# Patient Record
Sex: Male | Born: 1935 | Race: Black or African American | Hispanic: No | Marital: Married | Smoking: Former smoker
Health system: Southern US, Community
[De-identification: ages and names within clinical notes are randomized; demographics above are authoritative.]

## PROBLEM LIST (undated history)

## (undated) DIAGNOSIS — I69391 Dysphagia following cerebral infarction: Secondary | ICD-10-CM

## (undated) DIAGNOSIS — F0390 Unspecified dementia without behavioral disturbance: Secondary | ICD-10-CM

## (undated) DIAGNOSIS — I2510 Atherosclerotic heart disease of native coronary artery without angina pectoris: Secondary | ICD-10-CM

## (undated) DIAGNOSIS — I10 Essential (primary) hypertension: Secondary | ICD-10-CM

## (undated) DIAGNOSIS — I4891 Unspecified atrial fibrillation: Secondary | ICD-10-CM

## (undated) DIAGNOSIS — I5030 Unspecified diastolic (congestive) heart failure: Secondary | ICD-10-CM

## (undated) DIAGNOSIS — E119 Type 2 diabetes mellitus without complications: Secondary | ICD-10-CM

## (undated) DIAGNOSIS — E785 Hyperlipidemia, unspecified: Secondary | ICD-10-CM

## (undated) DIAGNOSIS — I619 Nontraumatic intracerebral hemorrhage, unspecified: Secondary | ICD-10-CM

## (undated) HISTORY — PX: PEG PLACEMENT: SHX5437

---

## 2018-10-14 ENCOUNTER — Emergency Department (HOSPITAL_COMMUNITY): Payer: Medicare Other

## 2018-10-14 ENCOUNTER — Inpatient Hospital Stay (HOSPITAL_COMMUNITY)
Admission: EM | Admit: 2018-10-14 | Discharge: 2018-10-24 | DRG: 070 | Disposition: A | Discharge status: Skilled Nursing Facility | Payer: Medicare Other | Attending: Family Medicine | Admitting: Family Medicine

## 2018-10-14 ENCOUNTER — Encounter (HOSPITAL_COMMUNITY): Payer: Self-pay | Admitting: Radiology

## 2018-10-14 ENCOUNTER — Other Ambulatory Visit: Payer: Self-pay

## 2018-10-14 DIAGNOSIS — I110 Hypertensive heart disease with heart failure: Secondary | ICD-10-CM | POA: Diagnosis present

## 2018-10-14 DIAGNOSIS — I10 Essential (primary) hypertension: Secondary | ICD-10-CM | POA: Diagnosis not present

## 2018-10-14 DIAGNOSIS — Z79899 Other long term (current) drug therapy: Secondary | ICD-10-CM

## 2018-10-14 DIAGNOSIS — R414 Neurologic neglect syndrome: Secondary | ICD-10-CM | POA: Diagnosis present

## 2018-10-14 DIAGNOSIS — K219 Gastro-esophageal reflux disease without esophagitis: Secondary | ICD-10-CM | POA: Diagnosis present

## 2018-10-14 DIAGNOSIS — Z20828 Contact with and (suspected) exposure to other viral communicable diseases: Secondary | ICD-10-CM | POA: Diagnosis present

## 2018-10-14 DIAGNOSIS — Z8673 Personal history of transient ischemic attack (TIA), and cerebral infarction without residual deficits: Secondary | ICD-10-CM | POA: Diagnosis not present

## 2018-10-14 DIAGNOSIS — I2510 Atherosclerotic heart disease of native coronary artery without angina pectoris: Secondary | ICD-10-CM | POA: Diagnosis present

## 2018-10-14 DIAGNOSIS — Z66 Do not resuscitate: Secondary | ICD-10-CM | POA: Diagnosis not present

## 2018-10-14 DIAGNOSIS — B964 Proteus (mirabilis) (morganii) as the cause of diseases classified elsewhere: Secondary | ICD-10-CM | POA: Diagnosis present

## 2018-10-14 DIAGNOSIS — R410 Disorientation, unspecified: Secondary | ICD-10-CM

## 2018-10-14 DIAGNOSIS — R1313 Dysphagia, pharyngeal phase: Secondary | ICD-10-CM | POA: Diagnosis present

## 2018-10-14 DIAGNOSIS — E785 Hyperlipidemia, unspecified: Secondary | ICD-10-CM | POA: Diagnosis present

## 2018-10-14 DIAGNOSIS — E860 Dehydration: Secondary | ICD-10-CM | POA: Diagnosis present

## 2018-10-14 DIAGNOSIS — Z7984 Long term (current) use of oral hypoglycemic drugs: Secondary | ICD-10-CM

## 2018-10-14 DIAGNOSIS — F0150 Vascular dementia without behavioral disturbance: Secondary | ICD-10-CM | POA: Diagnosis present

## 2018-10-14 DIAGNOSIS — G9340 Encephalopathy, unspecified: Secondary | ICD-10-CM | POA: Diagnosis present

## 2018-10-14 DIAGNOSIS — N390 Urinary tract infection, site not specified: Secondary | ICD-10-CM | POA: Diagnosis not present

## 2018-10-14 DIAGNOSIS — G9341 Metabolic encephalopathy: Principal | ICD-10-CM | POA: Diagnosis present

## 2018-10-14 DIAGNOSIS — R29704 NIHSS score 4: Secondary | ICD-10-CM | POA: Diagnosis present

## 2018-10-14 DIAGNOSIS — I4892 Unspecified atrial flutter: Secondary | ICD-10-CM | POA: Diagnosis present

## 2018-10-14 DIAGNOSIS — E43 Unspecified severe protein-calorie malnutrition: Secondary | ICD-10-CM | POA: Diagnosis present

## 2018-10-14 DIAGNOSIS — R1310 Dysphagia, unspecified: Secondary | ICD-10-CM

## 2018-10-14 DIAGNOSIS — Z87891 Personal history of nicotine dependence: Secondary | ICD-10-CM

## 2018-10-14 DIAGNOSIS — R4182 Altered mental status, unspecified: Secondary | ICD-10-CM | POA: Diagnosis present

## 2018-10-14 DIAGNOSIS — Z751 Person awaiting admission to adequate facility elsewhere: Secondary | ICD-10-CM

## 2018-10-14 DIAGNOSIS — Z515 Encounter for palliative care: Secondary | ICD-10-CM

## 2018-10-14 DIAGNOSIS — R29810 Facial weakness: Secondary | ICD-10-CM | POA: Diagnosis present

## 2018-10-14 DIAGNOSIS — M109 Gout, unspecified: Secondary | ICD-10-CM | POA: Diagnosis present

## 2018-10-14 DIAGNOSIS — N3000 Acute cystitis without hematuria: Secondary | ICD-10-CM

## 2018-10-14 DIAGNOSIS — I480 Paroxysmal atrial fibrillation: Secondary | ICD-10-CM | POA: Diagnosis present

## 2018-10-14 DIAGNOSIS — I5032 Chronic diastolic (congestive) heart failure: Secondary | ICD-10-CM | POA: Diagnosis present

## 2018-10-14 DIAGNOSIS — Z9181 History of falling: Secondary | ICD-10-CM

## 2018-10-14 DIAGNOSIS — E119 Type 2 diabetes mellitus without complications: Secondary | ICD-10-CM | POA: Diagnosis present

## 2018-10-14 DIAGNOSIS — I69319 Unspecified symptoms and signs involving cognitive functions following cerebral infarction: Secondary | ICD-10-CM

## 2018-10-14 DIAGNOSIS — R627 Adult failure to thrive: Secondary | ICD-10-CM | POA: Diagnosis present

## 2018-10-14 DIAGNOSIS — Z7982 Long term (current) use of aspirin: Secondary | ICD-10-CM

## 2018-10-14 HISTORY — DX: Hyperlipidemia, unspecified: E78.5

## 2018-10-14 HISTORY — DX: Essential (primary) hypertension: I10

## 2018-10-14 HISTORY — DX: Nontraumatic intracerebral hemorrhage, unspecified: I61.9

## 2018-10-14 HISTORY — DX: Atherosclerotic heart disease of native coronary artery without angina pectoris: I25.10

## 2018-10-14 LAB — COMPREHENSIVE METABOLIC PANEL
ALT: 12 U/L (ref 0–44)
AST: 18 U/L (ref 15–41)
Albumin: 3.2 g/dL — ABNORMAL LOW (ref 3.5–5.0)
Alkaline Phosphatase: 50 U/L (ref 38–126)
Anion gap: 11 (ref 5–15)
BUN: 15 mg/dL (ref 8–23)
CO2: 27 mmol/L (ref 22–32)
Calcium: 9.2 mg/dL (ref 8.9–10.3)
Chloride: 105 mmol/L (ref 98–111)
Creatinine, Ser: 0.96 mg/dL (ref 0.61–1.24)
GFR calc Af Amer: >60 mL/min (ref >60)
GFR calc non Af Amer: >60 mL/min (ref >60)
Glucose, Bld: 131 mg/dL — ABNORMAL HIGH (ref 70–99)
Potassium: 4.1 mmol/L (ref 3.5–5.1)
Sodium: 143 mmol/L (ref 135–145)
Total Bilirubin: 0.7 mg/dL (ref 0.3–1.2)
Total Protein: 7.3 g/dL (ref 6.5–8.1)

## 2018-10-14 LAB — DIFFERENTIAL
Abs Immature Granulocytes: 0.03 10*3/uL (ref 0.00–0.07)
Basophils Absolute: 0 10*3/uL (ref 0.0–0.1)
Basophils Relative: 0 %
Eosinophils Absolute: 0 10*3/uL (ref 0.0–0.5)
Eosinophils Relative: 0 %
Immature Granulocytes: 0 %
Lymphocytes Relative: 13 %
Lymphs Abs: 1.2 10*3/uL (ref 0.7–4.0)
Monocytes Absolute: 0.5 10*3/uL (ref 0.1–1.0)
Monocytes Relative: 6 %
Neutro Abs: 7.2 10*3/uL (ref 1.7–7.7)
Neutrophils Relative %: 81 %

## 2018-10-14 LAB — CBC
HCT: 32.6 % — ABNORMAL LOW (ref 39.0–52.0)
Hemoglobin: 10.8 g/dL — ABNORMAL LOW (ref 13.0–17.0)
MCH: 30.9 pg (ref 26.0–34.0)
MCHC: 33.1 g/dL (ref 30.0–36.0)
MCV: 93.1 fL (ref 80.0–100.0)
Platelets: 320 10*3/uL (ref 150–400)
RBC: 3.5 MIL/uL — ABNORMAL LOW (ref 4.22–5.81)
RDW: 16.6 % — ABNORMAL HIGH (ref 11.5–15.5)
WBC: 9 10*3/uL (ref 4.0–10.5)
nRBC: 0 % (ref 0.0–0.2)

## 2018-10-14 LAB — RAPID URINE DRUG SCREEN, HOSP PERFORMED
Amphetamines: NOT DETECTED
Barbiturates: NOT DETECTED
Benzodiazepines: NOT DETECTED
Cocaine: NOT DETECTED
Opiates: NOT DETECTED
Tetrahydrocannabinol: NOT DETECTED

## 2018-10-14 LAB — ETHANOL: Alcohol, Ethyl (B): <10 mg/dL (ref <10)

## 2018-10-14 LAB — URINALYSIS, ROUTINE W REFLEX MICROSCOPIC
Bilirubin Urine: NEGATIVE
Glucose, UA: NEGATIVE mg/dL
Hgb urine dipstick: NEGATIVE
Ketones, ur: NEGATIVE mg/dL
Nitrite: POSITIVE — AB
Protein, ur: 30 mg/dL — AB
Specific Gravity, Urine: 1.016 (ref 1.005–1.030)
WBC, UA: >50 WBC/hpf — ABNORMAL HIGH (ref 0–5)
pH: 8 (ref 5.0–8.0)

## 2018-10-14 LAB — APTT: aPTT: 27 seconds (ref 24–36)

## 2018-10-14 LAB — I-STAT CHEM 8, ED
BUN: 20 mg/dL (ref 8–23)
Calcium, Ion: 1.07 mmol/L — ABNORMAL LOW (ref 1.15–1.40)
Chloride: 105 mmol/L (ref 98–111)
Creatinine, Ser: 0.8 mg/dL (ref 0.61–1.24)
Glucose, Bld: 122 mg/dL — ABNORMAL HIGH (ref 70–99)
HCT: 36 % — ABNORMAL LOW (ref 39.0–52.0)
Hemoglobin: 12.2 g/dL — ABNORMAL LOW (ref 13.0–17.0)
Potassium: 4 mmol/L (ref 3.5–5.1)
Sodium: 142 mmol/L (ref 135–145)
TCO2: 26 mmol/L (ref 22–32)

## 2018-10-14 LAB — SARS CORONAVIRUS 2 BY RT PCR (HOSPITAL ORDER, PERFORMED IN ~~LOC~~ HOSPITAL LAB): SARS Coronavirus 2: NEGATIVE

## 2018-10-14 LAB — TSH: TSH: 2.618 u[IU]/mL (ref 0.350–4.500)

## 2018-10-14 LAB — AMMONIA: Ammonia: 53 umol/L — ABNORMAL HIGH (ref 9–35)

## 2018-10-14 LAB — PROTIME-INR
INR: 1 (ref 0.8–1.2)
Prothrombin Time: 12.6 seconds (ref 11.4–15.2)

## 2018-10-14 LAB — VITAMIN B12: Vitamin B-12: 530 pg/mL (ref 180–914)

## 2018-10-14 LAB — CBG MONITORING, ED: Glucose-Capillary: 127 mg/dL — ABNORMAL HIGH (ref 70–99)

## 2018-10-14 LAB — LACTIC ACID, PLASMA: Lactic Acid, Venous: 1.7 mmol/L (ref 0.5–1.9)

## 2018-10-14 LAB — TROPONIN I (HIGH SENSITIVITY): Troponin I (High Sensitivity): 8 ng/L (ref <18)

## 2018-10-14 MED ORDER — ACETAMINOPHEN 160 MG/5ML PO SOLN: 650.0000 mg | ORAL | Status: DC | PRN

## 2018-10-14 MED ORDER — ASPIRIN EC 325 MG PO TBEC: 325.0000 mg | DELAYED_RELEASE_TABLET | Freq: Once | ORAL | Status: AC

## 2018-10-14 MED ORDER — LACTATED RINGERS IV BOLUS
1000.0000 mL | Freq: Once | INTRAVENOUS | Status: AC
  Administered 2018-10-14: 20:00:00 1000 mL via INTRAVENOUS

## 2018-10-14 MED ORDER — ACETAMINOPHEN 650 MG RE SUPP
650.0000 mg | RECTAL | Status: DC | PRN
  Administered 2018-10-17: 650 mg via RECTAL
  Filled 2018-10-14 (×2): qty 1

## 2018-10-14 MED ORDER — LACTATED RINGERS IV BOLUS
1000.0000 mL | Freq: Once | INTRAVENOUS | Status: AC
  Administered 2018-10-14: 22:00:00 1000 mL via INTRAVENOUS

## 2018-10-14 MED ORDER — VANCOMYCIN HCL 10 G IV SOLR
1500.0000 mg | Freq: Once | INTRAVENOUS | Status: AC
  Administered 2018-10-14: 20:00:00 1500 mg via INTRAVENOUS
  Filled 2018-10-14: qty 1500

## 2018-10-14 MED ORDER — ACETAMINOPHEN 325 MG PO TABS: 650.0000 mg | ORAL_TABLET | ORAL | Status: DC | PRN

## 2018-10-14 MED ORDER — INSULIN ASPART 100 UNIT/ML ~~LOC~~ SOLN
0.0000 [IU] | SUBCUTANEOUS | Status: DC
  Administered 2018-10-17 (×2): 1 [IU] via SUBCUTANEOUS

## 2018-10-14 MED ORDER — METRONIDAZOLE IN NACL 5-0.79 MG/ML-% IV SOLN
500.0000 mg | Freq: Once | INTRAVENOUS | Status: AC
  Administered 2018-10-14: 21:00:00 500 mg via INTRAVENOUS
  Filled 2018-10-14: qty 100

## 2018-10-14 MED ORDER — SODIUM CHLORIDE 0.9 % IV SOLN
2.0000 g | Freq: Once | INTRAVENOUS | Status: DC
  Filled 2018-10-14: qty 2

## 2018-10-14 MED ORDER — ASPIRIN 300 MG RE SUPP
300.0000 mg | Freq: Once | RECTAL | Status: AC
  Administered 2018-10-14: 21:00:00 300 mg via RECTAL
  Filled 2018-10-14: qty 1

## 2018-10-14 MED ORDER — STROKE: EARLY STAGES OF RECOVERY BOOK
Freq: Once | Status: AC
  Administered 2018-10-15: 05:00:00
  Filled 2018-10-14: qty 1

## 2018-10-14 MED ORDER — SODIUM CHLORIDE 0.9% FLUSH
3.0000 mL | Freq: Once | INTRAVENOUS | Status: AC
  Administered 2018-10-14: 21:00:00 3 mL via INTRAVENOUS

## 2018-10-14 MED ORDER — ASPIRIN 325 MG PO TABS
325.0000 mg | ORAL_TABLET | Freq: Every day | ORAL | Status: DC
  Administered 2018-10-15: 11:00:00 325 mg via ORAL
  Filled 2018-10-14: qty 1

## 2018-10-14 MED ORDER — VANCOMYCIN HCL IN DEXTROSE 1-5 GM/200ML-% IV SOLN: 1000.0000 mg | Freq: Once | INTRAVENOUS | Status: DC

## 2018-10-14 MED ORDER — IOHEXOL 350 MG/ML SOLN: 100.0000 mL | Freq: Once | INTRAVENOUS | Status: DC | PRN

## 2018-10-14 MED ORDER — ASPIRIN 300 MG RE SUPP: 300.0000 mg | Freq: Every day | RECTAL | Status: DC

## 2018-10-14 NOTE — ED Triage Notes (Signed)
PT BIB [facility] from home. Per EMS around 1645, pt began having left side neglect and left sided facial droop.

## 2018-10-14 NOTE — Consult Note (Signed)
Stroke Neurology Consultation Note  Consult Requested by: Dr. Rodena Medin  Reason for Consult: code stroke  Consult Date: 10/14/18  The history was obtained from the EMS. Pt did not provide any history and EMS obtained hx from family members who are from [place] to visit pt. I called the number EMS provided, it went to pt caregiver who was not with pt at the time onset, and the family members have gone back to [place] at the time pt in ER and I was not able to contact them.   History of Present Illness:  Mason Webb. is a 84 y.o. African American male with PMH of stroke with unknown residue deficit who was with family members who came from [place] to visit him this afternoon. As per EMS, family members sent him to back porch to sit down but then found him to have acute onset confusion and left facial droop. Not sure about details of confusion but EMS noted that pt may have left UE neglect as they pinched his left arm and pt only looked at it without reaction. Code stroke called on the field. Glucose 127 and BP 152/77.   On ED arrival, pt not able to tell his name, and perseverated on questions with possible left nasolabial fold flattening but no other focal deficit. CT no acute finding. Difficulty IV access and not able to obtain CTA head and neck.   LSN: [id number] as per EMS tPA Given: No: mild non-disabling symptoms with otherwise non focal deficit.  History reviewed. No pertinent past medical history.  No family history on file.  Social History:  has no history on file for tobacco, alcohol, and drug.  Allergies: Not on File  No current facility-administered medications on file prior to encounter.    No current outpatient medications on file prior to encounter.    Review of Systems: A full ROS was attempted today and was not able to be performed.   Physical Examination:  General - well nourished, well developed, in no apparent distress.    Ophthalmologic - fundi not visualized due to  noncooperation.    Cardiovascular - regular rhythm and rate  Neuro - awake alert, orientated to self, month but not to age. Able to name 2 objects and then perseverated on previous answer. Moderate dysarthria, able to repeat one simple sentences. Paucity of speech. Follows simple commands although psychomotor slowing. Blinking to visual threat bilaterally, but perseverated on the finger number. Attending to both sides, EOMI, PERRL. Left nasolabial fold slight flattening. Tongue protrusion midline. Moving all extremities symmetrically and equally. Sensation subjectively symmetrical. FTN no obvious ataxia although not quit cooperative. Gait not tested.  NIH Stroke Scale  Level Of Consciousness 0=Alert; keenly responsive 1=Arouse to minor stimulation 2=Requires repeated stimulation to arouse or movements to pain 3=postures or unresponsive 0  LOC Questions to Month and Age 24=Answers both questions correctly 1=Answers one question correctly or dysarthria/intubated/trauma/language barrier 2=Answers neither question correctly or aphasia 1  LOC Commands      -Open/Close eyes     -Open/close grip     -Pantomime commands if communication barrier 0=Performs both tasks correctly 1=Performs one task correctly 2=Performs neighter task correctly 0  Best Gaze     -Only assess horizontal gaze 0=Normal 1=Partial gaze palsy 2=Forced deviation, or total gaze paresis 0  Visual 0=No visual loss 1=Partial hemianopia 2=Complete hemianopia 3=Bilateral hemianopia (blind including cortical blindness) 0  Facial Palsy     -Use grimace if obtunded 0=Normal symmetrical movement 1=Minor paralysis (asymmetry)  2=Partial paralysis (lower face) 3=Complete paralysis (upper and lower face) 1  Motor  0=No drift for 10/5 seconds 1=Drift, but does not hit bed 2=Some antigravity effort, hits  bed 3=No effort against gravity, limb falls 4=No movement 0=Amputation/joint fusion Right Arm 0     Leg 0    Left Arm 0      Leg 0  Limb Ataxia     - FNT/HTS 0=Absent or does not understand or paralyzed or amputation/joint fusion 1=Present in one limb 2=Present in two limbs 0  Sensory 0=Normal 1=Mild to moderate sensory loss 2=Severe to total sensory loss or coma/unresponsive 0  Best Language 0=No aphasia, normal 1=Mild to moderate aphasia 2=Severe aphasia 3=Mute, global aphasia, or coma/unresponsive 1  Dysarthria 0=Normal 1=Mild to moderate 2=Severe, unintelligible or mute/anarthric 0=intubated/unable to test 1  Extinction/Neglect 0=No abnormality 1=visual/tactile/auditory/spatia/personal inattention/Extinction to bilateral simultaneous stimulation 2=Profound neglect/extinction more than 1 modality  0  Total   4     Data Reviewed: Ct Head Code Stroke Wo Contrast  Result Date: 10/14/2018 CLINICAL DATA:  Code stroke. Focal neuro deficit, less than 6 hours, stroke suspected. Additional history provided: Left-sided neglect, left facial droop. EXAM: CT HEAD WITHOUT CONTRAST TECHNIQUE: Contiguous axial images were obtained from the base of the skull through the vertex without intravenous contrast. COMPARISON:  No pertinent prior studies available for comparison. FINDINGS: Brain: No evidence of acute intracranial hemorrhage. No acute demarcated cortical infarction identified. Ill-defined hypoattenuation of the cerebral white matter is nonspecific, but consistent with chronic small vessel ischemic disease. Chronic appearing lacunar infarcts within the left corona radiata/internal capsule and within the left thalamus. Age-indeterminate lacunar infarct within the left caudate nucleus. No evidence of intracranial mass. No midline shift or extra-axial fluid collection. Moderate generalized parenchymal atrophy. Vascular: No hyperdense vessel. Skull: No calvarial fracture Sinuses/Orbits: Visualized orbits demonstrate no acute abnormality. Mucosal thickening within the partially imaged left maxillary sinus. Trace left mastoid  effusion. ASPECTS [facility]([place] Stroke Program Early CT Score) - Ganglionic level infarction (caudate, lentiform nuclei, internal capsule, insula, M1-M3 cortex): 7 - Supraganglionic infarction (M4-M6 cortex): 3 Total score (0-10 with 10 being normal): 10 (in the right MCA vascular territory). These results were called by telephone at the time of interpretation on 10/14/2018 at 5:57 pm to provider Dr. Roda ShuttersXu, who verbally acknowledged these results. IMPRESSION: No evidence of intracranial hemorrhage or acute demarcated cortical infarction. Age-indeterminate lacunar infarct within the left caudate. Chronic lacunar infarcts within the left corona radiata/internal capsule and left thalamus. Generalized parenchymal atrophy with chronic small vessel ischemic disease. Electronically Signed   By: Jackey LogeKyle  Golden   On: 10/14/2018 18:07    Assessment: 84 y.o. male with PMH of stroke with unknown residue deficit presented for acute onset confusion and left facial droop as per family. Exam largely non focal with only not knowing his age, perseveration and slight left nasolabial fold flattening, not sure if from previous stroke. NIHSS = 4, CT no acute finding but significant atrophy. Etiology for symptoms not quite clear, acute confusion could be encephalopathy, presyncope, seizure. Although stroke is possible too, lack of focal deficit not supporting at this time. No tPA given non focal exam. Not able to obtain IV access, not endovascular candidate due to low NIHSS and non focal exam. Will recommend MRI and MRA for further evaluation.   Stroke Risk Factors - hx of stroke and lacunar old strokes on CT  Plan: - [facility] further encephalopathy and stroke work up  - MRI, MRA  of the brain without contrast - Echocardiogram - Carotid doppler - HgbA1c, fasting lipid panel, ammonia, and B12 level - UDS and UA - PT consult, OT consult, Speech consult - ASA 325 mg if po access or ASA 300 PR if not pass swallow  screen - Risk factor modification - Telemetry monitoring - Frequent neuro checks - will follow  Thank you for this consultation and allowing [place] to participate in the care of this patient.  Rosalin Hawking, MD PhD Stroke Neurology 10/14/2018 6:49 PM

## 2018-10-14 NOTE — ED Provider Notes (Signed)
[facility] EMERGENCY DEPARTMENT Provider Note   CSN: [id number] Arrival date & time: 10/14/18  1732  An emergency department physician performed an initial assessment on this suspected stroke patient at 1734.  History   Chief Complaint Chief Complaint  Patient presents with   Code Stroke    HPI Mason Webb. is a 84 y.o. male.     HPI  The patient is an 84 year old male with a history of hemorrhagic CVA and a diagnosis of vascular dementia who presents to the ED with AMS. EMS provided the pertinent history followed by his daughter over the phone who is a retired Therapist, sports. The patient has unknown residual deficits from his prior CVA. Mason Webb was sitting on the porch this afternoon having a conversation when Mason Webb developed acute onset confusion. EMS called and concern for possible L sided facial droop. BG 127 in the field and BP 152/77. A CODE STROKE was called in the field. His last known normal was 1645.  On arrival, the patient was AAOx1 to name only. Neurology was present on patient arrival and the patient was taken to the [facility]. There was difficulty obtaining IV access so a CT Head WO was performed and a STAT MRI was ordered. The patient was roomed and vascular access was obtained with a right sided EJ.   Past Medical History:  Diagnosis Date   CAD (coronary artery disease)    Hemorrhagic stroke ([place])    Hyperlipidemia    Hypertension     Patient Active Problem List   Diagnosis Date Noted   Acute lower UTI 10/14/2018   Altered mental status 10/14/2018   Hypertension 10/14/2018   Hyperlipidemia 10/14/2018    Past Surgical History:  Procedure Laterality Date   PEG PLACEMENT          Home Medications    Prior to Admission medications   Medication Sig Start Date End Date Taking? Authorizing Provider  allopurinol (ZYLOPRIM) 300 MG tablet Take 300 mg by mouth every morning. 07/20/18  Yes [provider]  amLODipine (NORVASC) 2.5 MG  tablet Take 2.5 mg by mouth every morning. 08/21/18  Yes [provider]  aspirin EC 81 MG tablet Take 81 mg by mouth every morning.   Yes [provider]  Cholecalciferol (VITAMIN D3 PO) Take 1 tablet by mouth every morning.   Yes [provider]  furosemide (LASIX) 20 MG tablet Take 20 mg by mouth every morning. 09/17/18  Yes [provider]  hydrALAZINE (APRESOLINE) 50 MG tablet Take 50 mg by mouth 2 (two) times daily. Hold for SBP <100 08/31/18  Yes [provider]  losartan (COZAAR) 25 MG tablet Take 25 mg by mouth every morning. 08/03/18  Yes [provider]  magnesium oxide (MAG-OX) 400 MG tablet Take 400 mg by mouth every morning. 07/20/18  Yes [provider]  metFORMIN (GLUCOPHAGE) 500 MG tablet Take 500 mg by mouth 2 (two) times daily with a meal. 08/31/18  Yes [provider]  metoprolol succinate (TOPROL-XL) 25 MG 24 hr tablet Take 25 mg by mouth every morning. 10/10/18  Yes [provider]  omeprazole (PRILOSEC) 40 MG capsule Take 40 mg by mouth every morning. 05/26/15  Yes [provider]  pravastatin (PRAVACHOL) 10 MG tablet Take 10 mg by mouth at bedtime.   Yes [provider]  QUEtiapine (SEROQUEL) 50 MG tablet Take 50 mg by mouth at bedtime as needed (anxiety).   Yes [provider]  vitamin B-12 (CYANOCOBALAMIN) 250 MCG tablet Take 250 mcg by mouth every morning.   Yes [provider]    Family History History reviewed. No pertinent family history.  Social History Social History   Tobacco Use   Smoking status: Former Smoker   Smokeless tobacco: Never Used  Substance Use Topics   Alcohol use: Not Currently   Drug use: Not on file     Allergies   Patient has no known allergies.   Review of Systems Review of Systems  Unable to perform ROS: Dementia     Physical Exam Updated Vital Signs BP (!) 182/95    Pulse 70    Temp 100.3 F (37.9 C) (Oral)     Resp 13    SpO2 100%   Physical Exam Vitals signs and nursing note reviewed.  Constitutional:      Appearance: Mason Webb is well-developed.  HENT:     Head: Normocephalic and atraumatic.  Eyes:     Conjunctiva/sclera: Conjunctivae normal.  Neck:     Musculoskeletal: Neck supple.  Cardiovascular:     Rate and Rhythm: Normal rate and regular rhythm.     Heart sounds: No murmur.  Pulmonary:     Effort: Pulmonary effort is normal. No respiratory distress.     Breath sounds: Normal breath sounds.  Abdominal:     Palpations: Abdomen is soft.     Tenderness: There is no abdominal tenderness.  Skin:    General: Skin is warm and dry.     Capillary Refill: Capillary refill takes 2 to 3 seconds.  Neurological:     General: No focal deficit present.     Mental Status: Mason Webb is alert. Mason Webb is disoriented.     Cranial Nerves: No cranial nerve deficit.     Sensory: No sensory deficit.     Motor: No weakness.      ED Treatments / Results  Labs (all labs ordered are listed, but only abnormal results are displayed) Labs Reviewed  CBC - Abnormal; Notable for the following components:      Result Value   RBC 3.50 (*)    Hemoglobin 10.8 (*)    HCT 32.6 (*)    RDW 16.6 (*)    All other components within normal limits  COMPREHENSIVE METABOLIC PANEL - Abnormal; Notable for the following components:   Glucose, Bld 131 (*)    Albumin 3.2 (*)    All other components within normal limits  URINALYSIS, ROUTINE W REFLEX MICROSCOPIC - Abnormal; Notable for the following components:   APPearance CLOUDY (*)    Protein, ur 30 (*)    Nitrite POSITIVE (*)    Leukocytes,Ua LARGE (*)    WBC, UA >50 (*)    Bacteria, UA MANY (*)    All other components within normal limits  AMMONIA - Abnormal; Notable for the following components:   Ammonia 53 (*)    All other components within normal limits  I-STAT CHEM 8, ED - Abnormal; Notable for the following components:   Glucose, Bld 122 (*)    Calcium, Ion 1.07 (*)     Hemoglobin 12.2 (*)    HCT 36.0 (*)    All other components within normal limits  CBG MONITORING, ED - Abnormal; Notable for the following components:   Glucose-Capillary 127 (*)    All other components within normal limits  CBG MONITORING, ED - Abnormal; Notable for the following components:   Glucose-Capillary 121 (*)    All other components within normal  limits  SARS CORONAVIRUS 2 (HOSPITAL ORDER, PERFORMED IN [facility])  CULTURE, BLOOD (ROUTINE X 2)  CULTURE, BLOOD (ROUTINE X 2)  URINE CULTURE  PROTIME-INR  APTT  DIFFERENTIAL  ETHANOL  RAPID URINE DRUG SCREEN, HOSP PERFORMED  VITAMIN B12  TSH  LACTIC ACID, PLASMA  LACTIC ACID, PLASMA  HEMOGLOBIN A1C  LIPID PANEL  CBC  COMPREHENSIVE METABOLIC PANEL  TROPONIN I (HIGH SENSITIVITY)  TROPONIN I (HIGH SENSITIVITY)    EKG EKG Interpretation  Date/Time:  Saturday October 14 2018 19:58:02 EDT Ventricular Rate:  91 PR Interval:    QRS Duration: 89 QT Interval:  387 QTC Calculation: 455 R Axis:   33 Text Interpretation:  Atrial flutter Abnormal R-wave progression, early transition Consider left ventricular hypertrophy Anterior ST elevation, probably due to LVH Confirmed by Kristine RoyalMessick, Peter [phone]([id number]) on 10/14/2018 8:00:02 PM   Radiology Dg Chest Port 1 View  Result Date: 10/14/2018 CLINICAL DATA:  84 year old male with history of weakness. Left-sided neglect and left facial droop. EXAM: PORTABLE CHEST 1 VIEW COMPARISON:  Chest x-ray 07/24/2018. FINDINGS: Lung volumes are low. No consolidative airspace disease. No pleural effusions. No pneumothorax. No pulmonary nodule or mass noted. Pulmonary vasculature and the cardiomediastinal silhouette are within normal limits. IMPRESSION: 1. Low lung volumes without radiographic evidence acute cardiopulmonary disease. Electronically Signed   By: Trudie Reedaniel  Entrikin M.D.   On: 10/14/2018 20:44   Ct Head Code Stroke Wo Contrast  Result Date: 10/14/2018 CLINICAL DATA:  Code  stroke. Focal neuro deficit, less than 6 hours, stroke suspected. Additional history provided: Left-sided neglect, left facial droop. EXAM: CT HEAD WITHOUT CONTRAST TECHNIQUE: Contiguous axial images were obtained from the base of the skull through the vertex without intravenous contrast. COMPARISON:  No pertinent prior studies available for comparison. FINDINGS: Brain: No evidence of acute intracranial hemorrhage. No acute demarcated cortical infarction identified. Ill-defined hypoattenuation of the cerebral white matter is nonspecific, but consistent with chronic small vessel ischemic disease. Chronic appearing lacunar infarcts within the left corona radiata/internal capsule and within the left thalamus. Age-indeterminate lacunar infarct within the left caudate nucleus. No evidence of intracranial mass. No midline shift or extra-axial fluid collection. Moderate generalized parenchymal atrophy. Vascular: No hyperdense vessel. Skull: No calvarial fracture Sinuses/Orbits: Visualized orbits demonstrate no acute abnormality. Mucosal thickening within the partially imaged left maxillary sinus. Trace left mastoid effusion. ASPECTS [facility]([place] Stroke Program Early CT Score) - Ganglionic level infarction (caudate, lentiform nuclei, internal capsule, insula, M1-M3 cortex): 7 - Supraganglionic infarction (M4-M6 cortex): 3 Total score (0-10 with 10 being normal): 10 (in the right MCA vascular territory). These results were called by telephone at the time of interpretation on 10/14/2018 at 5:57 pm to provider Dr. Roda ShuttersXu, who verbally acknowledged these results. IMPRESSION: No evidence of intracranial hemorrhage or acute demarcated cortical infarction. Age-indeterminate lacunar infarct within the left caudate. Chronic lacunar infarcts within the left corona radiata/internal capsule and left thalamus. Generalized parenchymal atrophy with chronic small vessel ischemic disease. Electronically Signed   By: Jackey LogeKyle  Golden   On: 10/14/2018  18:07    Procedures Procedures (including critical care time)  Medications Ordered in ED Medications  iohexol (OMNIPAQUE) 350 MG/ML injection 100 mL (has no administration in time range)   stroke: mapping our early stages of recovery book (has no administration in time range)  acetaminophen (TYLENOL) tablet 650 mg (has no administration in time range)    Or  acetaminophen (TYLENOL) solution 650 mg (has no administration in time range)    Or  acetaminophen (TYLENOL) suppository 650 mg (has no administration in time range)  aspirin suppository 300 mg (has no administration in time range)    Or  aspirin tablet 325 mg (has no administration in time range)  insulin aspart (novoLOG) injection 0-9 Units (0 Units Subcutaneous Not Given 10/15/18 0157)  cefTRIAXone (ROCEPHIN) 1 g in sodium chloride 0.9 % 100 mL IVPB (1 g Intravenous New Bag/Given 10/15/18 0230)  sodium chloride flush (NS) 0.9 % injection 3 mL (3 mLs Intravenous Given 10/14/18 2103)  aspirin EC tablet 325 mg ( Oral See Alternative 10/14/18 2103)    Or  aspirin suppository 300 mg (300 mg Rectal Given 10/14/18 2103)  lactated ringers bolus 1,000 mL (0 mLs Intravenous Stopped 10/14/18 2126)  metroNIDAZOLE (FLAGYL) IVPB 500 mg (0 mg Intravenous Stopped 10/14/18 2213)  vancomycin (VANCOCIN) 1,500 mg in sodium chloride 0.9 % 500 mL IVPB (0 mg Intravenous Stopped 10/15/18 0052)  lactated ringers bolus 1,000 mL (0 mLs Intravenous Stopped 10/15/18 0230)     Initial Impression / Assessment and Plan / ED Course  I have reviewed the triage vital signs and the nursing notes.  Pertinent labs & imaging results that were available during my care of the patient were reviewed by me and considered in my medical decision making (see chart for details).  Clinical Course as of Oct 15 307  Sat Oct 14, 2018  2127 Temp: 100.3 F (37.9 C) [JL]  2207 Bacteria, UA(!): MANY [JL]  2207 Nitrite(!): POSITIVE [JL]  2207 Leukocytes,Ua(!): LARGE [JL]  2207  WBC, UA(!): >50 [JL]  Sun Oct 15, 2018  0023 Hemoglobin(!): 10.8 [JL]    Clinical Course User Index [JL] Ernie Avena, MD       On arrival, the patient was AAOx1 to name only. Neurology was present on patient arrival and the patient was taken to the CT Scanner. There was difficulty obtaining IV access so a CT Head WO was performed and a STAT MRI was ordered. The patient was roomed and vascular access was obtained with a right sided EJ.   The patient was found to have an oral temperature to 100.22F. Mason Webb was initially covered with broad spectrum ABX given concern for possible bacterial infection. Additional etiology could be from encephalopathy. COVID testing resulted negative. The patient appeared dehydrated on exam with highly collapsible veins so 2L LR boluses were ordered. Labs grossly unremarkable with the exception of a UA concerning for UTI. Ammonia mildly elevated without asterixis on exam. CBG 127. A CXR was unremarkable. An EKG revealed atrial flutter, poor R wave progression, ST elevation V3-4 with no priors for comparison. An initial troponin was negative. Lactic acid normal. MRI imaging pending. Blood and urine cultures pending but were drawn prior to antibiotic administration. 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 Hospitalist medicine consulted for admission. Plan for continued stroke workup to include MRI, MRA, Echo, carotid dopplers, telemetry while inpatient.   Final Clinical Impressions(s) / ED Diagnoses   Final diagnoses:  Acute cystitis without hematuria  Altered mental status, unspecified altered mental status type    ED Discharge Orders    None       Ernie Avena, MD 10/15/18 0310    Wynetta Fines, MD 10/16/18 2122

## 2018-10-14 NOTE — H&P (Addendum)
TRH H&P    Patient Demographics:    Mason Webb, is a 84 y.o. male  MRN: [id number]  DOB - February 26, 1934  Admit Date - 10/14/2018  Referring MD/NP/PA:  Armandina Gemma  Outpatient Primary MD for the patient is Burman Freestone, MD  Patient coming from:  home  Chief complaint- altered mental status   HPI:    Mason Webb  is a 84 y.o. male, w hypertension, hyperlipidemia, ? Dm2, Cad , apparently presents with altered mental status , confusion, and ? Left facial droop and left upper extremity neglect.  Pt is axoxo1 (person), and is a poor historian. Unable to provide history.   In ED T 100.3, P 89 R 22, Bp 167/94  Pox 100% on RA  CT brain IMPRESSION: No evidence of intracranial hemorrhage or acute demarcated cortical infarction.  Age-indeterminate lacunar infarct within the left caudate. Chronic lacunar infarcts within the left corona radiata/internal capsule and left thalamus.  Generalized parenchymal atrophy with chronic small vessel ischemic disease.  CXR IMPRESSION: 1. Low lung volumes without radiographic evidence acute cardiopulmonary disease.  Na 143, K 4.1, Bun 15, Creatinine 0.96 Ast 18, Alt 12, Alk phos 50, T. Bili 0.7 Lactic acid 1.7  Trop 8 Etoh <10 Ammonia 53,  B12 530 TSH 2.618 UDS negative urinalysis wbc>50, rbc 0-5  covid -19 negative  Pt will be admitted for ams secondary to UTI, r/o CVA    Review of systems:    In addition to the HPI above, pt unable to provide ROS due to AMS    No Headache, No changes with Vision or hearing, No problems swallowing food or Liquids, No Chest pain, Cough or Shortness of Breath, No Abdominal pain, No Nausea or Vomiting, bowel movements are regular, No Blood in stool or Urine, No dysuria, No new skin rashes or bruises, No new joints pains-aches,  No new weakness, tingling, numbness in any extremity, No recent weight gain or  loss, No polyuria, polydypsia or polyphagia, No significant Mental Stressors.  All other systems reviewed and are negative.    Past History of the following :    Past Medical History:  Diagnosis Date  . CAD (coronary artery disease)   . Hemorrhagic stroke ([place])   . Hyperlipidemia   . Hypertension       Past Surgical History:  Procedure Laterality Date  . PEG PLACEMENT        Social History:      Social History   Tobacco Use  . Smoking status: Former Research scientist (life sciences)  . Smokeless tobacco: Never Used  Substance Use Topics  . Alcohol use: Not Currently       Family History :    History reviewed. No pertinent family history.   Unable to obtain from patient due to AMS   Home Medications:   Prior to Admission medications   Medication Sig Start Date End Date Taking? Authorizing Provider  allopurinol (ZYLOPRIM) 300 MG tablet Take 300 mg by mouth every morning. 07/20/18  Yes [provider]  amLODipine (NORVASC) 2.5 MG tablet Take  2.5 mg by mouth every morning. 08/21/18  Yes [provider]  aspirin EC 81 MG tablet Take 81 mg by mouth every morning.   Yes [provider]  Cholecalciferol (VITAMIN D3 PO) Take 1 tablet by mouth every morning.   Yes [provider]  furosemide (LASIX) 20 MG tablet Take 20 mg by mouth every morning. 09/17/18  Yes [provider]  hydrALAZINE (APRESOLINE) 50 MG tablet Take 50 mg by mouth 2 (two) times daily. Hold for SBP <100 08/31/18  Yes [provider]  losartan (COZAAR) 25 MG tablet Take 25 mg by mouth every morning. 08/03/18  Yes [provider]  magnesium oxide (MAG-OX) 400 MG tablet Take 400 mg by mouth every morning. 07/20/18  Yes [provider]  metFORMIN (GLUCOPHAGE) 500 MG tablet Take 500 mg by mouth 2 (two) times daily with a meal. 08/31/18  Yes [provider]  metoprolol succinate (TOPROL-XL) 25 MG 24 hr tablet Take 25 mg by mouth every morning. 10/10/18  Yes  [provider]  omeprazole (PRILOSEC) 40 MG capsule Take 40 mg by mouth every morning. 05/26/15  Yes [provider]  pravastatin (PRAVACHOL) 10 MG tablet Take 10 mg by mouth at bedtime.   Yes [provider]  QUEtiapine (SEROQUEL) 50 MG tablet Take 50 mg by mouth at bedtime as needed (anxiety).   Yes [provider]  vitamin B-12 (CYANOCOBALAMIN) 250 MCG tablet Take 250 mcg by mouth every morning.   Yes [provider]     Allergies:    No Known Allergies   Physical Exam:   Vitals  Blood pressure (!) 155/99, pulse 78, temperature 100.3 F (37.9 C), temperature source Oral, resp. rate 14, SpO2 97 %.  1.  General: axoxo1 (person)  2. Psychiatric: euthymic  3. Neurologic: cn2-12 intact, reflexes 2+ symmetric, diffuse with no clonus, motor 5/5 in all 4 ext  4. HEENMT:  Anicteric, pupils 1.12m symmtric, direct, consensual, near intact No facial droop, tongue midline Neck: no jvd, no bruit  5. Respiratory : CTAB  6. Cardiovascular : rrr s1, s2,   7. Gastrointestinal:  Abd: soft, nt, nd, +bs  8. Skin:  Ext: no c/c/e, no rash  9.Musculoskeletal:  Good ROM    Data Review:    CBC Recent Labs  Lab 10/14/18 1737 10/14/18 1739  WBC 9.0  --   HGB 10.8* 12.2*  HCT 32.6* 36.0*  PLT 320  --   MCV 93.1  --   MCH 30.9  --   MCHC 33.1  --   RDW 16.6*  --   LYMPHSABS 1.2  --   MONOABS 0.5  --   EOSABS 0.0  --   BASOSABS 0.0  --    ------------------------------------------------------------------------------------------------------------------  Results for orders placed or performed during the hospital encounter of 10/14/18 (from the past 48 hour(s))  CBG monitoring, ED     Status: Abnormal   Collection Time: 10/14/18  5:35 PM  Result Value Ref Range   Glucose-Capillary 127 (H) 70 - 99 mg/dL   Comment 1 Notify RN    Comment 2 Document in Chart   Protime-INR     Status: None   Collection Time: 10/14/18  5:37 PM   Result Value Ref Range   Prothrombin Time 12.6 11.4 - 15.2 seconds   INR 1.0 0.8 - 1.2    Comment: (NOTE) INR goal varies based on device and disease states. Performed at [facility] [address]  [place] [address]   APTT     Status: None   Collection Time: 10/14/18  5:37 PM  Result Value Ref Range   aPTT 27 24 - 36 seconds    Comment: Performed at [facility] [address], [place], [place] [address]  CBC     Status: Abnormal   Collection Time: 10/14/18  5:37 PM  Result Value Ref Range   WBC 9.0 4.0 - 10.5 K/uL   RBC 3.50 (L) 4.22 - 5.81 MIL/uL   Hemoglobin 10.8 (L) 13.0 - 17.0 g/dL   HCT 32.6 (L) 39.0 - 52.0 %   MCV 93.1 80.0 - 100.0 fL   MCH 30.9 26.0 - 34.0 pg   MCHC 33.1 30.0 - 36.0 g/dL   RDW 16.6 (H) 11.5 - 15.5 %   Platelets 320 150 - 400 K/uL   nRBC 0.0 0.0 - 0.2 %    Comment: Performed at [facility] [address] [place], [place] [address]  Differential     Status: None   Collection Time: 10/14/18  5:37 PM  Result Value Ref Range   Neutrophils Relative % 81 %   Neutro Abs 7.2 1.7 - 7.7 K/uL   Lymphocytes Relative 13 %   Lymphs Abs 1.2 0.7 - 4.0 K/uL   Monocytes Relative 6 %   Monocytes Absolute 0.5 0.1 - 1.0 K/uL   Eosinophils Relative 0 %   Eosinophils Absolute 0.0 0.0 - 0.5 K/uL   Basophils Relative 0 %   Basophils Absolute 0.0 0.0 - 0.1 K/uL   Immature Granulocytes 0 %   Abs Immature Granulocytes 0.03 0.00 - 0.07 K/uL    Comment: Performed at [facility], [place] [address], [place], [place] [address]  Comprehensive metabolic panel     Status: Abnormal   Collection Time: 10/14/18  5:37 PM  Result Value Ref Range   Sodium 143 135 - 145 mmol/L   Potassium 4.1 3.5 - 5.1 mmol/L   Chloride 105 98 - 111 mmol/L   CO2 27 22 - 32 mmol/L   Glucose, Bld 131 (H) 70 - 99 mg/dL   BUN 15 8 - 23 mg/dL   Creatinine, Ser 0.96 0.61 - 1.24 mg/dL   Calcium 9.2 8.9 - 10.3 mg/dL   Total Protein 7.3 6.5 - 8.1 g/dL   Albumin 3.2  (L) 3.5 - 5.0 g/dL   AST 18 15 - 41 U/L   ALT 12 0 - 44 U/L   Alkaline Phosphatase 50 38 - 126 U/L   Total Bilirubin 0.7 0.3 - 1.2 mg/dL   GFR calc non Af Amer >60 >60 mL/min   GFR calc Af Amer >60 >60 mL/min   Anion gap 11 5 - 15    Comment: Performed at [facility] [address] [place], [place] [address]  Troponin I (High Sensitivity)     Status: None   Collection Time: 10/14/18  5:37 PM  Result Value Ref Range   Troponin I (High Sensitivity) 8 <18 ng/L    Comment: (NOTE) Elevated high sensitivity troponin I (hsTnI) values and significant  changes across serial measurements may suggest ACS but many other  chronic and acute conditions are known to elevate hsTnI results.  Refer to the "Links" section for chest pain algorithms and additional  guidance. Performed at [facility], [place] [address] [place], [place] [address]   I-stat chem 8, ED     Status: Abnormal   Collection Time:  10/14/18  5:39 PM  Result Value Ref Range   Sodium 142 135 - 145 mmol/L   Potassium 4.0 3.5 - 5.1 mmol/L   Chloride 105 98 - 111 mmol/L   BUN 20 8 - 23 mg/dL   Creatinine, Ser 0.80 0.61 - 1.24 mg/dL   Glucose, Bld 122 (H) 70 - 99 mg/dL   Calcium, Ion 1.07 (L) 1.15 - 1.40 mmol/L   TCO2 26 22 - 32 mmol/L   Hemoglobin 12.2 (L) 13.0 - 17.0 g/dL   HCT 36.0 (L) 39.0 - 52.0 %  Ethanol     Status: None   Collection Time: 10/14/18  6:42 PM  Result Value Ref Range   Alcohol, Ethyl (B) <10 <10 mg/dL    Comment: (NOTE) Lowest detectable limit for serum alcohol is 10 mg/dL. For medical purposes only. Performed at [facility], [place] [address], [place], [place] [address]   Ammonia     Status: Abnormal   Collection Time: 10/14/18  6:48 PM  Result Value Ref Range   Ammonia 53 (H) 9 - 35 umol/L    Comment: Performed at [facility], [place] [address], [place], [place] [address]  SARS Coronavirus 2 [facility] order, Performed in [facility] hospital lab) Nasopharyngeal Nasopharyngeal  Swab     Status: None   Collection Time: 10/14/18  6:55 PM   Specimen: Nasopharyngeal Swab  Result Value Ref Range   SARS Coronavirus 2 NEGATIVE NEGATIVE    Comment: (NOTE) If result is NEGATIVE SARS-CoV-2 target nucleic acids are NOT DETECTED. The SARS-CoV-2 RNA is generally detectable in upper and lower  respiratory specimens during the acute phase of infection. The lowest  concentration of SARS-CoV-2 viral copies this assay can detect is 250  copies / mL. A negative result does not preclude SARS-CoV-2 infection  and should not be used as the sole basis for treatment or other  patient management decisions.  A negative result may occur with  improper specimen collection / handling, submission of specimen other  than nasopharyngeal swab, presence of viral mutation(s) within the  areas targeted by this assay, and inadequate number of viral copies  (<250 copies / mL). A negative result must be combined with clinical  observations, patient history, and epidemiological information. If result is POSITIVE SARS-CoV-2 target nucleic acids are DETECTED. The SARS-CoV-2 RNA is generally detectable in upper and lower  respiratory specimens dur ing the acute phase of infection.  Positive  results are indicative of active infection with SARS-CoV-2.  Clinical  correlation with patient history and other diagnostic information is  necessary to determine patient infection status.  Positive results do  not rule out bacterial infection or co-infection with other viruses. If result is PRESUMPTIVE POSTIVE SARS-CoV-2 nucleic acids MAY BE PRESENT.   A presumptive positive result was obtained on the submitted specimen  and confirmed on repeat testing.  While 2019 novel coronavirus  (SARS-CoV-2) nucleic acids may be present in the submitted sample  additional confirmatory testing may be necessary for epidemiological  and / or clinical management purposes  to differentiate between  SARS-CoV-2 and other  Sarbecovirus currently known to infect humans.  If clinically indicated additional testing with an alternate test  methodology [phone]) is advised. The SARS-CoV-2 RNA is generally  detectable in upper and lower respiratory sp ecimens during the acute  phase of infection. The expected result is Negative. Fact Sheet for Patients:  StrictlyIdeas.no Fact Sheet for Healthcare Providers: BankingDealers.co.za This test is not yet approved or  cleared by the [place] and has been authorized for detection and/or diagnosis of SARS-CoV-2 by FDA under an Emergency Use Authorization (EUA).  This EUA will remain in effect (meaning this test can be used) for the duration of the COVID-19 declaration under Section 564(b)(1) of the Act, 21 U.S.C. section 360bbb-3(b)(1), unless the authorization is terminated or revoked sooner. Performed at [facility], [place] [address] [place], [place] [address]   Vitamin B12     Status: None   Collection Time: 10/14/18  7:58 PM  Result Value Ref Range   Vitamin B-12 530 180 - 914 pg/mL    Comment: (NOTE) This assay is not validated for testing neonatal or myeloproliferative syndrome specimens for Vitamin B12 levels. Performed at [facility], [place] [address] [place], [place] [address]   TSH     Status: None   Collection Time: 10/14/18  7:58 PM  Result Value Ref Range   TSH 2.618 0.350 - 4.500 uIU/mL    Comment: Performed by a 3rd Generation assay with a functional sensitivity of <=0.01 uIU/mL. Performed at [facility], [place] [address], [place], [place] [address]   Lactic acid, plasma     Status: None   Collection Time: 10/14/18  7:58 PM  Result Value Ref Range   Lactic Acid, Venous 1.7 0.5 - 1.9 mmol/L    Comment: Performed at [facility] [address] [place], [place] [address]  Urine rapid drug screen (hosp performed)     Status: None   Collection Time: 10/14/18  9:05 PM   Result Value Ref Range   Opiates NONE DETECTED NONE DETECTED   Cocaine NONE DETECTED NONE DETECTED   Benzodiazepines NONE DETECTED NONE DETECTED   Amphetamines NONE DETECTED NONE DETECTED   Tetrahydrocannabinol NONE DETECTED NONE DETECTED   Barbiturates NONE DETECTED NONE DETECTED    Comment: (NOTE) DRUG SCREEN FOR MEDICAL PURPOSES ONLY.  IF CONFIRMATION IS NEEDED FOR ANY PURPOSE, NOTIFY LAB WITHIN 5 DAYS. LOWEST DETECTABLE LIMITS FOR URINE DRUG SCREEN Drug Class                     Cutoff (ng/mL) Amphetamine and metabolites    1000 Barbiturate and metabolites    200 Benzodiazepine                 093 Tricyclics and metabolites     300 Opiates and metabolites        300 Cocaine and metabolites        300 THC                            50 Performed at [facility], [place] [address], [place], [place] [address]   Urinalysis, Routine w reflex microscopic     Status: Abnormal   Collection Time: 10/14/18  9:05 PM  Result Value Ref Range   Color, Urine YELLOW YELLOW   APPearance CLOUDY (A) CLEAR   Specific Gravity, Urine 1.016 1.005 - 1.030   pH 8.0 5.0 - 8.0   Glucose, UA NEGATIVE NEGATIVE mg/dL   Hgb urine dipstick NEGATIVE NEGATIVE   Bilirubin Urine NEGATIVE NEGATIVE   Ketones, ur NEGATIVE NEGATIVE mg/dL   Protein, ur 30 (A) NEGATIVE mg/dL   Nitrite POSITIVE (A) NEGATIVE   Leukocytes,Ua LARGE (A) NEGATIVE   RBC / HPF 0-5 0 - 5 RBC/hpf   WBC, UA >50 (H) 0 - 5  WBC/hpf   Bacteria, UA MANY (A) NONE SEEN   Squamous Epithelial / LPF 0-5 0 - 5   WBC Clumps PRESENT    Mucus PRESENT     Comment: Performed at [facility], [place] [address], [place], [place] [address]    Chemistries  Recent Labs  Lab 10/14/18 1737 10/14/18 1739  NA 143 142  K 4.1 4.0  CL 105 105  CO2 27  --   GLUCOSE 131* 122*  BUN 15 20  CREATININE 0.96 0.80  CALCIUM 9.2  --   AST 18  --   ALT 12  --   ALKPHOS 50  --   BILITOT 0.7  --     ------------------------------------------------------------------------------------------------------------------  ------------------------------------------------------------------------------------------------------------------ GFR: CrCl cannot be calculated (Unknown ideal weight.). Liver Function Tests: Recent Labs  Lab 10/14/18 1737  AST 18  ALT 12  ALKPHOS 50  BILITOT 0.7  PROT 7.3  ALBUMIN 3.2*   No results for input(s): LIPASE, AMYLASE in the last 168 hours. Recent Labs  Lab 10/14/18 1848  AMMONIA 53*   Coagulation Profile: Recent Labs  Lab 10/14/18 1737  INR 1.0   Cardiac Enzymes: No results for input(s): CKTOTAL, CKMB, CKMBINDEX, TROPONINI in the last 168 hours. BNP (last 3 results) No results for input(s): PROBNP in the last 8760 hours. HbA1C: No results for input(s): HGBA1C in the last 72 hours. CBG: Recent Labs  Lab 10/14/18 1735  GLUCAP 127*   Lipid Profile: No results for input(s): CHOL, HDL, LDLCALC, TRIG, CHOLHDL, LDLDIRECT in the last 72 hours. Thyroid Function Tests: Recent Labs    10/14/18 1958  TSH 2.618   Anemia Panel: Recent Labs    10/14/18 1958  VITAMINB12 530    --------------------------------------------------------------------------------------------------------------- Urine analysis:    Component Value Date/Time   COLORURINE YELLOW 10/14/2018 2105   APPEARANCEUR CLOUDY (A) 10/14/2018 2105   LABSPEC 1.016 10/14/2018 2105   PHURINE 8.0 10/14/2018 2105   GLUCOSEU NEGATIVE 10/14/2018 2105   HGBUR NEGATIVE 10/14/2018 2105   BILIRUBINUR NEGATIVE 10/14/2018 2105   [place] NEGATIVE 10/14/2018 2105   PROTEINUR 30 (A) 10/14/2018 2105   NITRITE POSITIVE (A) 10/14/2018 2105   LEUKOCYTESUR LARGE (A) 10/14/2018 2105      Imaging Results:    Dg Chest Port 1 View  Result Date: 10/14/2018 CLINICAL DATA:  84 year old male with history of weakness. Left-sided neglect and left facial droop. EXAM: PORTABLE CHEST 1 VIEW  COMPARISON:  Chest x-ray 07/24/2018. FINDINGS: Lung volumes are low. No consolidative airspace disease. No pleural effusions. No pneumothorax. No pulmonary nodule or mass noted. Pulmonary vasculature and the cardiomediastinal silhouette are within normal limits. IMPRESSION: 1. Low lung volumes without radiographic evidence acute cardiopulmonary disease. Electronically Signed   By: Vinnie Langton M.D.   On: 10/14/2018 20:44   Ct Head Code Stroke Wo Contrast  Result Date: 10/14/2018 CLINICAL DATA:  Code stroke. Focal neuro deficit, less than 6 hours, stroke suspected. Additional history provided: Left-sided neglect, left facial droop. EXAM: CT HEAD WITHOUT CONTRAST TECHNIQUE: Contiguous axial images were obtained from the base of the skull through the vertex without intravenous contrast. COMPARISON:  No pertinent prior studies available for comparison. FINDINGS: Brain: No evidence of acute intracranial hemorrhage. No acute demarcated cortical infarction identified. Ill-defined hypoattenuation of the cerebral white matter is nonspecific, but consistent with chronic small vessel ischemic disease. Chronic appearing lacunar infarcts within the left corona radiata/internal capsule and within the left thalamus. Age-indeterminate lacunar infarct within the left caudate nucleus. No evidence of intracranial  mass. No midline shift or extra-axial fluid collection. Moderate generalized parenchymal atrophy. Vascular: No hyperdense vessel. Skull: No calvarial fracture Sinuses/Orbits: Visualized orbits demonstrate no acute abnormality. Mucosal thickening within the partially imaged left maxillary sinus. Trace left mastoid effusion. ASPECTS [facility] Stroke Program Early CT Score) - Ganglionic level infarction (caudate, lentiform nuclei, internal capsule, insula, M1-M3 cortex): 7 - Supraganglionic infarction (M4-M6 cortex): 3 Total score (0-10 with 10 being normal): 10 (in the right MCA vascular territory). These results were  called by telephone at the time of interpretation on 10/14/2018 at 5:57 pm to provider Dr. Erlinda Hong, who verbally acknowledged these results. IMPRESSION: No evidence of intracranial hemorrhage or acute demarcated cortical infarction. Age-indeterminate lacunar infarct within the left caudate. Chronic lacunar infarcts within the left corona radiata/internal capsule and left thalamus. Generalized parenchymal atrophy with chronic small vessel ischemic disease. Electronically Signed   By: Kellie Simmering   On: 10/14/2018 18:07   nsr at 80, nl axis, poor R progression, St elevation in v3,4 (no prior for comparison)    Assessment & Plan:    Principal Problem:   Altered mental status Active Problems:   Acute lower UTI   Hypertension   Hyperlipidemia  AMS secondary to UTI Acute Lower UTI Urine culture Rocephin 1gm iv qday  AMS ? L facial droop/ LUE neglect ? TIA/CVA MRI / MRA brain pending Check carotid ultrasound Check cardiac echo PT/OT, speech consults Aspirin Neurology consulted, appreciate input  Hypertension Cotn Amlodipine 2.48m po qday Cont Hydralazine 536mpo bid Cont Lasix 2036mo qday Cont Losartan 14m19m qday Cont Toprol XL 14mg110mqday Hydralazine 10mg 6m6h prn sbp  >160  Hyperlipidemia Cont Pravastatin 10mg p34ms  ? DM2 STOP Metformin 500mg po56m fsbs q4h , ISS  Gerd Cont PPI  Gout Cont Allopurinol 300mg po 67m  If patient fails swallow study will hold oral medication   DVT Prophylaxis-   SCDs   AM Labs Ordered, also please review Full Orders  Family Communication: Admission, patients condition and plan of care including tests being ordered have been discussed with the patient who indicate understanding and agree with the plan and Code Status.  Code Status:  FULL CODE per patient, left message for daughter [facility]  Admission status: Observation : Based on patients clinical presentation and evaluation of above clinical data, I  have made determination that patient meets observation criteria at this time.   Time spent in minutes : 70 minutes   Vernie Piet KimJani Gravel0/03/2018 at 11:30 PM

## 2018-10-14 NOTE — ED Notes (Signed)
ED TO INPATIENT HANDOFF REPORT  ED Nurse Name and Phone #: Jeannett Senior [phone]  W Name/Age/Gender Mason Webb. 84 y.o. male Room/Bed: 019C/019C  Code Status   Code Status: Full Code  Home/SNF/Other Home Patient oriented to: self Is this baseline? Yes   Triage Complete: Triage complete  Chief Complaint Code STROKE  Triage Note PT BIB [facility] from home. Per EMS around 1645, pt began having left side neglect and left sided facial droop.   Allergies No Known Allergies  Level of Care/Admitting Diagnosis ED Disposition    ED Disposition Condition Comment   Admit  Hospital Area: [facility] [[id number]]  Level of Care: Telemetry Medical [104]  I expect the patient will be discharged within 24 hours: No (not a candidate for 5C-Observation unit)  Covid Evaluation: Confirmed COVID Negative  Diagnosis: Altered mental status [780.97.ICD-9-CM]  Admitting Physician: Pearson Grippe [[id number]]  Attending Physician: Pearson Grippe [[id number]]  PT Class (Do Not Modify): Observation [104]  PT Acc Code (Do Not Modify): Observation [10022]       B Medical/Surgery History Past Medical History:  Diagnosis Date  . CAD (coronary artery disease)   . Hemorrhagic stroke (HCC)   . Hyperlipidemia   . Hypertension    Past Surgical History:  Procedure Laterality Date  . PEG PLACEMENT       A IV Location/Drains/Wounds Patient Lines/Drains/Airways Status   Active Line/Drains/Airways    Name:   Placement date:   Placement time:   Site:   Days:   Peripheral IV 10/14/18 Right External jugular   10/14/18    1958    External jugular   less than 1          Intake/Output Last 24 hours No intake or output data in the 24 hours ending 10/14/18 2339  Labs/Imaging Results for orders placed or performed during the hospital encounter of 10/14/18 (from the past 48 hour(s))  CBG monitoring, ED     Status: Abnormal   Collection Time: 10/14/18  5:35 PM  Result Value Ref Range   Glucose-Capillary  127 (H) 70 - 99 mg/dL   Comment 1 Notify RN    Comment 2 Document in Chart   Protime-INR     Status: None   Collection Time: 10/14/18  5:37 PM  Result Value Ref Range   Prothrombin Time 12.6 11.4 - 15.2 seconds   INR 1.0 0.8 - 1.2    Comment: (NOTE) INR goal varies based on device and disease states. Performed at [facility], [address], [place], [place] [address]   APTT     Status: None   Collection Time: 10/14/18  5:37 PM  Result Value Ref Range   aPTT 27 24 - 36 seconds    Comment: Performed at [facility], [address], [place], [place] [address]  CBC     Status: Abnormal   Collection Time: 10/14/18  5:37 PM  Result Value Ref Range   WBC 9.0 4.0 - 10.5 K/uL   RBC 3.50 (L) 4.22 - 5.81 MIL/uL   Hemoglobin 10.8 (L) 13.0 - 17.0 g/dL   HCT 29.5 (L) 62.1 - 30.8 %   MCV 93.1 80.0 - 100.0 fL   MCH 30.9 26.0 - 34.0 pg   MCHC 33.1 30.0 - 36.0 g/dL   RDW 65.7 (H) 84.6 - 96.2 %   Platelets 320 150 - 400 K/uL   nRBC 0.0 0.0 - 0.2 %    Comment: Performed at [facility], [address] [place], [place] [address]  Differential     Status: None   Collection Time: 10/14/18  5:37 PM  Result Value Ref Range   Neutrophils Relative % 81 %   Neutro Abs 7.2 1.7 - 7.7 K/uL   Lymphocytes Relative 13 %   Lymphs Abs 1.2 0.7 - 4.0 K/uL   Monocytes Relative 6 %   Monocytes Absolute 0.5 0.1 - 1.0 K/uL   Eosinophils Relative 0 %   Eosinophils Absolute 0.0 0.0 - 0.5 K/uL   Basophils Relative 0 %   Basophils Absolute 0.0 0.0 - 0.1 K/uL   Immature Granulocytes 0 %   Abs Immature Granulocytes 0.03 0.00 - 0.07 K/uL    Comment: Performed at [facility], [address] [place], [place] [address]  Comprehensive metabolic panel     Status: Abnormal   Collection Time: 10/14/18  5:37 PM  Result Value Ref Range   Sodium 143 135 - 145 mmol/L   Potassium 4.1 3.5 - 5.1 mmol/L   Chloride 105 98 - 111 mmol/L   CO2 27 22 - 32 mmol/L   Glucose, Bld 131 (H) 70 - 99 mg/dL    BUN 15 8 - 23 mg/dL   Creatinine, Ser 0.98 0.61 - 1.24 mg/dL   Calcium 9.2 8.9 - 11.9 mg/dL   Total Protein 7.3 6.5 - 8.1 g/dL   Albumin 3.2 (L) 3.5 - 5.0 g/dL   AST 18 15 - 41 U/L   ALT 12 0 - 44 U/L   Alkaline Phosphatase 50 38 - 126 U/L   Total Bilirubin 0.7 0.3 - 1.2 mg/dL   GFR calc non Af Amer >60 >60 mL/min   GFR calc Af Amer >60 >60 mL/min   Anion gap 11 5 - 15    Comment: Performed at [facility], [address], [place], [place] [address]  Troponin I (High Sensitivity)     Status: None   Collection Time: 10/14/18  5:37 PM  Result Value Ref Range   Troponin I (High Sensitivity) 8 <18 ng/L    Comment: (NOTE) Elevated high sensitivity troponin I (hsTnI) values and significant  changes across serial measurements may suggest ACS but many other  chronic and acute conditions are known to elevate hsTnI results.  Refer to the "Links" section for chest pain algorithms and additional  guidance. Performed at [facility], [address] [place], [place] [address]   I-stat chem 8, ED     Status: Abnormal   Collection Time: 10/14/18  5:39 PM  Result Value Ref Range   Sodium 142 135 - 145 mmol/L   Potassium 4.0 3.5 - 5.1 mmol/L   Chloride 105 98 - 111 mmol/L   BUN 20 8 - 23 mg/dL   Creatinine, Ser 3.08 0.61 - 1.24 mg/dL   Glucose, Bld 657 (H) 70 - 99 mg/dL   Calcium, Ion 8.46 (L) 1.15 - 1.40 mmol/L   TCO2 26 22 - 32 mmol/L   Hemoglobin 12.2 (L) 13.0 - 17.0 g/dL   HCT 96.2 (L) 95.2 - 84.1 %  Ethanol     Status: None   Collection Time: 10/14/18  6:42 PM  Result Value Ref Range   Alcohol, Ethyl (B) <10 <10 mg/dL    Comment: (NOTE) Lowest detectable limit for serum alcohol is 10 mg/dL. For medical purposes only. Performed at [facility], [address] [place], [place] [address]   Ammonia  Status: Abnormal   Collection Time: 10/14/18  6:48 PM  Result Value Ref Range   Ammonia 53 (H) 9 - 35 umol/L    Comment: Performed at [facility],  [address], [place], [place] [address]  SARS Coronavirus 2 [facility] order, Performed in [facility] hospital lab) Nasopharyngeal Nasopharyngeal Swab     Status: None   Collection Time: 10/14/18  6:55 PM   Specimen: Nasopharyngeal Swab  Result Value Ref Range   SARS Coronavirus 2 NEGATIVE NEGATIVE    Comment: (NOTE) If result is NEGATIVE SARS-CoV-2 target nucleic acids are NOT DETECTED. The SARS-CoV-2 RNA is generally detectable in upper and lower  respiratory specimens during the acute phase of infection. The lowest  concentration of SARS-CoV-2 viral copies this assay can detect is 250  copies / mL. A negative result does not preclude SARS-CoV-2 infection  and should not be used as the sole basis for treatment or other  patient management decisions.  A negative result may occur with  improper specimen collection / handling, submission of specimen other  than nasopharyngeal swab, presence of viral mutation(s) within the  areas targeted by this assay, and inadequate number of viral copies  (<250 copies / mL). A negative result must be combined with clinical  observations, patient history, and epidemiological information. If result is POSITIVE SARS-CoV-2 target nucleic acids are DETECTED. The SARS-CoV-2 RNA is generally detectable in upper and lower  respiratory specimens dur ing the acute phase of infection.  Positive  results are indicative of active infection with SARS-CoV-2.  Clinical  correlation with patient history and other diagnostic information is  necessary to determine patient infection status.  Positive results do  not rule out bacterial infection or co-infection with other viruses. If result is PRESUMPTIVE POSTIVE SARS-CoV-2 nucleic acids MAY BE PRESENT.   A presumptive positive result was obtained on the submitted specimen  and confirmed on repeat testing.  While 2019 novel coronavirus  (SARS-CoV-2) nucleic acids may be present in the submitted sample  additional  confirmatory testing may be necessary for epidemiological  and / or clinical management purposes  to differentiate between  SARS-CoV-2 and other Sarbecovirus currently known to infect humans.  If clinically indicated additional testing with an alternate test  methodology [phone]) is advised. The SARS-CoV-2 RNA is generally  detectable in upper and lower respiratory sp ecimens during the acute  phase of infection. The expected result is Negative. Fact Sheet for Patients:  BoilerBrush.com.cy Fact Sheet for Healthcare Providers: https://pope.com/ This test is not yet approved or cleared by the [place] FDA and has been authorized for detection and/or diagnosis of SARS-CoV-2 by FDA under an Emergency Use Authorization (EUA).  This EUA will remain in effect (meaning this test can be used) for the duration of the COVID-19 declaration under Section 564(b)(1) of the Act, 21 U.S.C. section 360bbb-3(b)(1), unless the authorization is terminated or revoked sooner. Performed at [facility], [address] [place], [place] [address]   Vitamin B12     Status: None   Collection Time: 10/14/18  7:58 PM  Result Value Ref Range   Vitamin B-12 530 180 - 914 pg/mL    Comment: (NOTE) This assay is not validated for testing neonatal or myeloproliferative syndrome specimens for Vitamin B12 levels. Performed at [facility], [address], [place], [place] [address]   TSH     Status: None   Collection Time: 10/14/18  7:58 PM  Result Value Ref Range  TSH 2.618 0.350 - 4.500 uIU/mL    Comment: Performed by a 3rd Generation assay with a functional sensitivity of <=0.01 uIU/mL. Performed at [facility], [place] [address], [place], [place] [address]   Lactic acid, plasma     Status: None   Collection Time: 10/14/18  7:58 PM  Result Value Ref Range   Lactic Acid, Venous 1.7 0.5 - 1.9 mmol/L    Comment: Performed at [place] [address], [place], [place] [address]  Urine rapid drug screen (hosp performed)     Status: None   Collection Time: 10/14/18  9:05 PM  Result Value Ref Range   Opiates NONE DETECTED NONE DETECTED   Cocaine NONE DETECTED NONE DETECTED   Benzodiazepines NONE DETECTED NONE DETECTED   Amphetamines NONE DETECTED NONE DETECTED   Tetrahydrocannabinol NONE DETECTED NONE DETECTED   Barbiturates NONE DETECTED NONE DETECTED    Comment: (NOTE) DRUG SCREEN FOR MEDICAL PURPOSES ONLY.  IF CONFIRMATION IS NEEDED FOR ANY PURPOSE, NOTIFY LAB WITHIN 5 DAYS. LOWEST DETECTABLE LIMITS FOR URINE DRUG SCREEN Drug Class                     Cutoff (ng/mL) Amphetamine and metabolites    1000 Barbiturate and metabolites    200 Benzodiazepine                 657 Tricyclics and metabolites     300 Opiates and metabolites        300 Cocaine and metabolites        300 THC                            50 Performed at [facility], [place] [address] [place], [place] [address]   Urinalysis, Routine w reflex microscopic     Status: Abnormal   Collection Time: 10/14/18  9:05 PM  Result Value Ref Range   Color, Urine YELLOW YELLOW   APPearance CLOUDY (A) CLEAR   Specific Gravity, Urine 1.016 1.005 - 1.030   pH 8.0 5.0 - 8.0   Glucose, UA NEGATIVE NEGATIVE mg/dL   Hgb urine dipstick NEGATIVE NEGATIVE   Bilirubin Urine NEGATIVE NEGATIVE   Ketones, ur NEGATIVE NEGATIVE mg/dL   Protein, ur 30 (A) NEGATIVE mg/dL   Nitrite POSITIVE (A) NEGATIVE   Leukocytes,Ua LARGE (A) NEGATIVE   RBC / HPF 0-5 0 - 5 RBC/hpf   WBC, UA >50 (H) 0 - 5 WBC/hpf   Bacteria, UA MANY (A) NONE SEEN   Squamous Epithelial / LPF 0-5 0 - 5   WBC Clumps PRESENT    Mucus PRESENT     Comment: Performed at [facility], [place] [address], [place], [place] [address]   Dg Chest Port 1 View  Result Date: 10/14/2018 CLINICAL DATA:  84 year old male with history of weakness. Left-sided neglect and left facial droop. EXAM:  PORTABLE CHEST 1 VIEW COMPARISON:  Chest x-ray 07/24/2018. FINDINGS: Lung volumes are low. No consolidative airspace disease. No pleural effusions. No pneumothorax. No pulmonary nodule or mass noted. Pulmonary vasculature and the cardiomediastinal silhouette are within normal limits. IMPRESSION: 1. Low lung volumes without radiographic evidence acute cardiopulmonary disease. Electronically Signed   By: Vinnie Langton M.D.   On: 10/14/2018 20:44   Ct Head Code Stroke Wo Contrast  Result Date: 10/14/2018 CLINICAL DATA:  Code stroke. Focal neuro deficit, less than 6 hours, stroke suspected.  Additional history provided: Left-sided neglect, left facial droop. EXAM: CT HEAD WITHOUT CONTRAST TECHNIQUE: Contiguous axial images were obtained from the base of the skull through the vertex without intravenous contrast. COMPARISON:  No pertinent prior studies available for comparison. FINDINGS: Brain: No evidence of acute intracranial hemorrhage. No acute demarcated cortical infarction identified. Ill-defined hypoattenuation of the cerebral white matter is nonspecific, but consistent with chronic small vessel ischemic disease. Chronic appearing lacunar infarcts within the left corona radiata/internal capsule and within the left thalamus. Age-indeterminate lacunar infarct within the left caudate nucleus. No evidence of intracranial mass. No midline shift or extra-axial fluid collection. Moderate generalized parenchymal atrophy. Vascular: No hyperdense vessel. Skull: No calvarial fracture Sinuses/Orbits: Visualized orbits demonstrate no acute abnormality. Mucosal thickening within the partially imaged left maxillary sinus. Trace left mastoid effusion. ASPECTS [facility]([place] Stroke Program Early CT Score) - Ganglionic level infarction (caudate, lentiform nuclei, internal capsule, insula, M1-M3 cortex): 7 - Supraganglionic infarction (M4-M6 cortex): 3 Total score (0-10 with 10 being normal): 10 (in the right MCA vascular territory).  These results were called by telephone at the time of interpretation on 10/14/2018 at 5:57 pm to provider Dr. Roda ShuttersXu, who verbally acknowledged these results. IMPRESSION: No evidence of intracranial hemorrhage or acute demarcated cortical infarction. Age-indeterminate lacunar infarct within the left caudate. Chronic lacunar infarcts within the left corona radiata/internal capsule and left thalamus. Generalized parenchymal atrophy with chronic small vessel ischemic disease. Electronically Signed   By: Jackey LogeKyle  Golden   On: 10/14/2018 18:07    Pending Labs Unresulted Labs (From admission, onward)    Start     Ordered   10/15/18 0500  Hemoglobin A1c  Tomorrow morning,   R     10/14/18 2329   10/15/18 0500  Lipid panel  Tomorrow morning,   R    Comments: Fasting    10/14/18 2329   10/15/18 0500  CBC  Tomorrow morning,   R     10/14/18 2330   10/15/18 0500  Comprehensive metabolic panel  Tomorrow morning,   R     10/14/18 2330   10/14/18 1857  Lactic acid, plasma  Now then every 2 hours,   STAT     10/14/18 1858   10/14/18 1857  Blood Culture (routine x 2)  BLOOD CULTURE X 2,   STAT     10/14/18 1858   10/14/18 1857  Urine culture  ONCE - STAT,   STAT     10/14/18 1858          Vitals/Pain Today's Vitals   10/14/18 2130 10/14/18 2145 10/14/18 2200 10/14/18 2215  BP: (!) 161/90 (!) 162/94 (!) 156/90 (!) 155/99  Pulse:      Resp: 15 15 15 14   Temp:      TempSrc:      SpO2:      PainSc:        Isolation Precautions No active isolations  Medications Medications  iohexol (OMNIPAQUE) 350 MG/ML injection 100 mL (has no administration in time range)  ceFEPIme (MAXIPIME) 2 g in sodium chloride 0.9 % 100 mL IVPB (has no administration in time range)   stroke: mapping our early stages of recovery book (has no administration in time range)  acetaminophen (TYLENOL) tablet 650 mg (has no administration in time range)    Or  acetaminophen (TYLENOL) solution 650 mg (has no administration in time  range)    Or  acetaminophen (TYLENOL) suppository 650 mg (has no administration in time range)  aspirin suppository 300 mg (  has no administration in time range)    Or  aspirin tablet 325 mg (has no administration in time range)  insulin aspart (novoLOG) injection 0-9 Units (has no administration in time range)  sodium chloride flush (NS) 0.9 % injection 3 mL (3 mLs Intravenous Given 10/14/18 2103)  aspirin EC tablet 325 mg ( Oral See Alternative 10/14/18 2103)    Or  aspirin suppository 300 mg (300 mg Rectal Given 10/14/18 2103)  lactated ringers bolus 1,000 mL (0 mLs Intravenous Stopped 10/14/18 2126)  metroNIDAZOLE (FLAGYL) IVPB 500 mg (0 mg Intravenous Stopped 10/14/18 2213)  vancomycin (VANCOCIN) 1,500 mg in sodium chloride 0.9 % 500 mL IVPB (1,500 mg Intravenous New Bag/Given 10/14/18 2015)  lactated ringers bolus 1,000 mL (1,000 mLs Intravenous New Bag/Given 10/14/18 2222)    Mobility non-ambulatory Low fall risk   Focused Assessments Neuro Assessment Handoff:  Swallow screen pass? No    NIH Stroke Scale ( + Modified Stroke Scale Criteria)  Interval: Initial Level of Consciousness (1a.)   : Alert, keenly responsive LOC Questions (1b. )   +: Answers neither question correctly LOC Commands (1c. )   + : Performs one task correctly Best Gaze (2. )  +: Normal Visual (3. )  +: No visual loss Facial Palsy (4. )    : Normal symmetrical movements Motor Arm, Left (5a. )   +: Drift Motor Arm, Right (5b. )   +: Drift Motor Leg, Left (6a. )   +: Drift Motor Leg, Right (6b. )   +: Drift Limb Ataxia (7. ): Absent Sensory (8. )   +: Normal, no sensory loss Best Language (9. )   +: No aphasia Dysarthria (10. ): Normal Extinction/Inattention (11.)   +: No Abnormality Modified SS Total  +: 7 Complete NIHSS TOTAL: 6 Last date known well: 10/14/18 Last time known well: 1645 Neuro Assessment: Within Defined Limits Neuro Checks:   Initial (10/14/18 1832)  Last Documented NIHSS Modified  Score: 7 (10/14/18 2229) Has TPA been given? No If patient is a Neuro Trauma and patient is going to OR before floor call report to 4N Charge nurse: [phone] or [phone]     R Recommendations: See Admitting Provider Note  Report given to:   Additional Notes:

## 2018-10-15 ENCOUNTER — Observation Stay (HOSPITAL_COMMUNITY): Payer: Medicare Other

## 2018-10-15 DIAGNOSIS — I361 Nonrheumatic tricuspid (valve) insufficiency: Secondary | ICD-10-CM

## 2018-10-15 DIAGNOSIS — R4182 Altered mental status, unspecified: Secondary | ICD-10-CM | POA: Diagnosis present

## 2018-10-15 DIAGNOSIS — M109 Gout, unspecified: Secondary | ICD-10-CM | POA: Diagnosis present

## 2018-10-15 DIAGNOSIS — I69319 Unspecified symptoms and signs involving cognitive functions following cerebral infarction: Secondary | ICD-10-CM | POA: Diagnosis not present

## 2018-10-15 DIAGNOSIS — N3000 Acute cystitis without hematuria: Secondary | ICD-10-CM | POA: Diagnosis not present

## 2018-10-15 DIAGNOSIS — E119 Type 2 diabetes mellitus without complications: Secondary | ICD-10-CM | POA: Diagnosis present

## 2018-10-15 DIAGNOSIS — I4892 Unspecified atrial flutter: Secondary | ICD-10-CM | POA: Diagnosis present

## 2018-10-15 DIAGNOSIS — I10 Essential (primary) hypertension: Secondary | ICD-10-CM | POA: Diagnosis not present

## 2018-10-15 DIAGNOSIS — E860 Dehydration: Secondary | ICD-10-CM | POA: Diagnosis present

## 2018-10-15 DIAGNOSIS — N390 Urinary tract infection, site not specified: Secondary | ICD-10-CM | POA: Diagnosis present

## 2018-10-15 DIAGNOSIS — I110 Hypertensive heart disease with heart failure: Secondary | ICD-10-CM | POA: Diagnosis present

## 2018-10-15 DIAGNOSIS — Z66 Do not resuscitate: Secondary | ICD-10-CM | POA: Diagnosis not present

## 2018-10-15 DIAGNOSIS — Z751 Person awaiting admission to adequate facility elsewhere: Secondary | ICD-10-CM | POA: Diagnosis not present

## 2018-10-15 DIAGNOSIS — R29810 Facial weakness: Secondary | ICD-10-CM | POA: Diagnosis present

## 2018-10-15 DIAGNOSIS — I5032 Chronic diastolic (congestive) heart failure: Secondary | ICD-10-CM | POA: Diagnosis present

## 2018-10-15 DIAGNOSIS — R1310 Dysphagia, unspecified: Secondary | ICD-10-CM | POA: Diagnosis not present

## 2018-10-15 DIAGNOSIS — Z515 Encounter for palliative care: Secondary | ICD-10-CM | POA: Diagnosis not present

## 2018-10-15 DIAGNOSIS — I480 Paroxysmal atrial fibrillation: Secondary | ICD-10-CM | POA: Diagnosis present

## 2018-10-15 DIAGNOSIS — I2510 Atherosclerotic heart disease of native coronary artery without angina pectoris: Secondary | ICD-10-CM | POA: Diagnosis present

## 2018-10-15 DIAGNOSIS — Z20828 Contact with and (suspected) exposure to other viral communicable diseases: Secondary | ICD-10-CM | POA: Diagnosis present

## 2018-10-15 DIAGNOSIS — R29704 NIHSS score 4: Secondary | ICD-10-CM | POA: Diagnosis present

## 2018-10-15 DIAGNOSIS — G9341 Metabolic encephalopathy: Secondary | ICD-10-CM | POA: Diagnosis present

## 2018-10-15 DIAGNOSIS — G9340 Encephalopathy, unspecified: Secondary | ICD-10-CM | POA: Diagnosis present

## 2018-10-15 DIAGNOSIS — E785 Hyperlipidemia, unspecified: Secondary | ICD-10-CM | POA: Diagnosis present

## 2018-10-15 DIAGNOSIS — R627 Adult failure to thrive: Secondary | ICD-10-CM | POA: Diagnosis present

## 2018-10-15 DIAGNOSIS — R1313 Dysphagia, pharyngeal phase: Secondary | ICD-10-CM | POA: Diagnosis present

## 2018-10-15 DIAGNOSIS — F0150 Vascular dementia without behavioral disturbance: Secondary | ICD-10-CM | POA: Diagnosis present

## 2018-10-15 DIAGNOSIS — K219 Gastro-esophageal reflux disease without esophagitis: Secondary | ICD-10-CM | POA: Diagnosis present

## 2018-10-15 DIAGNOSIS — E43 Unspecified severe protein-calorie malnutrition: Secondary | ICD-10-CM | POA: Diagnosis present

## 2018-10-15 DIAGNOSIS — B964 Proteus (mirabilis) (morganii) as the cause of diseases classified elsewhere: Secondary | ICD-10-CM | POA: Diagnosis present

## 2018-10-15 DIAGNOSIS — R414 Neurologic neglect syndrome: Secondary | ICD-10-CM | POA: Diagnosis present

## 2018-10-15 LAB — COMPREHENSIVE METABOLIC PANEL
ALT: 7 U/L (ref 0–44)
AST: 14 U/L — ABNORMAL LOW (ref 15–41)
Albumin: 2.6 g/dL — ABNORMAL LOW (ref 3.5–5.0)
Alkaline Phosphatase: 51 U/L (ref 38–126)
Anion gap: 11 (ref 5–15)
BUN: 14 mg/dL (ref 8–23)
CO2: 27 mmol/L (ref 22–32)
Calcium: 8.8 mg/dL — ABNORMAL LOW (ref 8.9–10.3)
Chloride: 104 mmol/L (ref 98–111)
Creatinine, Ser: 0.83 mg/dL (ref 0.61–1.24)
GFR calc Af Amer: >60 mL/min (ref >60)
GFR calc non Af Amer: >60 mL/min (ref >60)
Glucose, Bld: 114 mg/dL — ABNORMAL HIGH (ref 70–99)
Potassium: 3.9 mmol/L (ref 3.5–5.1)
Sodium: 142 mmol/L (ref 135–145)
Total Bilirubin: 0.4 mg/dL (ref 0.3–1.2)
Total Protein: 6.5 g/dL (ref 6.5–8.1)

## 2018-10-15 LAB — LIPID PANEL
Cholesterol: 138 mg/dL (ref 0–200)
HDL: 47 mg/dL (ref >40)
LDL Cholesterol: 79 mg/dL (ref 0–99)
Total CHOL/HDL Ratio: 2.9 RATIO
Triglycerides: 61 mg/dL (ref <150)
VLDL: 12 mg/dL (ref 0–40)

## 2018-10-15 LAB — CBC
HCT: 31.2 % — ABNORMAL LOW (ref 39.0–52.0)
Hemoglobin: 10 g/dL — ABNORMAL LOW (ref 13.0–17.0)
MCH: 29.9 pg (ref 26.0–34.0)
MCHC: 32.1 g/dL (ref 30.0–36.0)
MCV: 93.4 fL (ref 80.0–100.0)
Platelets: 276 10*3/uL (ref 150–400)
RBC: 3.34 MIL/uL — ABNORMAL LOW (ref 4.22–5.81)
RDW: 16.6 % — ABNORMAL HIGH (ref 11.5–15.5)
WBC: 10.3 10*3/uL (ref 4.0–10.5)
nRBC: 0 % (ref 0.0–0.2)

## 2018-10-15 LAB — CBG MONITORING, ED
Glucose-Capillary: 121 mg/dL — ABNORMAL HIGH (ref 70–99)
Glucose-Capillary: 106 mg/dL — ABNORMAL HIGH (ref 70–99)
Glucose-Capillary: 95 mg/dL (ref 70–99)
Glucose-Capillary: 81 mg/dL (ref 70–99)
Glucose-Capillary: 81 mg/dL (ref 70–99)
Glucose-Capillary: 82 mg/dL (ref 70–99)

## 2018-10-15 LAB — TROPONIN I (HIGH SENSITIVITY): Troponin I (High Sensitivity): 11 ng/L (ref <18)

## 2018-10-15 LAB — HEMOGLOBIN A1C
Hgb A1c MFr Bld: 5.5 % (ref 4.8–5.6)
Mean Plasma Glucose: 111.15 mg/dL

## 2018-10-15 LAB — LACTIC ACID, PLASMA: Lactic Acid, Venous: 1.4 mmol/L (ref 0.5–1.9)

## 2018-10-15 MED ORDER — QUETIAPINE FUMARATE 50 MG PO TABS
50.0000 mg | ORAL_TABLET | Freq: Every evening | ORAL | Status: DC | PRN
  Filled 2018-10-15: qty 1

## 2018-10-15 MED ORDER — HYDRALAZINE HCL 50 MG PO TABS
50.0000 mg | ORAL_TABLET | Freq: Two times a day (BID) | ORAL | Status: DC
  Administered 2018-10-15 – 2018-10-24 (×13): 50 mg via ORAL
  Filled 2018-10-15: qty 2
  Filled 2018-10-15 (×12): qty 1

## 2018-10-15 MED ORDER — METOPROLOL SUCCINATE ER 25 MG PO TB24
25.0000 mg | ORAL_TABLET | Freq: Every morning | ORAL | Status: DC
  Administered 2018-10-17 – 2018-10-24 (×7): 25 mg via ORAL
  Filled 2018-10-15 (×8): qty 1

## 2018-10-15 MED ORDER — SODIUM CHLORIDE 0.9 % IV SOLN
1.0000 g | INTRAVENOUS | Status: DC
  Administered 2018-10-15 – 2018-10-17 (×3): 1 g via INTRAVENOUS
  Filled 2018-10-15 (×3): qty 10

## 2018-10-15 MED ORDER — LOSARTAN POTASSIUM 25 MG PO TABS
25.0000 mg | ORAL_TABLET | Freq: Every morning | ORAL | Status: DC
  Administered 2018-10-15 – 2018-10-24 (×8): 25 mg via ORAL
  Filled 2018-10-15 (×8): qty 1

## 2018-10-15 MED ORDER — MAGNESIUM OXIDE 400 (241.3 MG) MG PO TABS
400.0000 mg | ORAL_TABLET | Freq: Every day | ORAL | Status: DC
  Administered 2018-10-17 – 2018-10-24 (×7): 400 mg via ORAL
  Filled 2018-10-15 (×8): qty 1

## 2018-10-15 MED ORDER — FUROSEMIDE 20 MG PO TABS
20.0000 mg | ORAL_TABLET | Freq: Every morning | ORAL | Status: DC
  Filled 2018-10-15: qty 1

## 2018-10-15 MED ORDER — AMLODIPINE BESYLATE 2.5 MG PO TABS
2.5000 mg | ORAL_TABLET | Freq: Every morning | ORAL | Status: DC
  Administered 2018-10-15 – 2018-10-24 (×8): 2.5 mg via ORAL
  Filled 2018-10-15 (×8): qty 1

## 2018-10-15 MED ORDER — PANTOPRAZOLE SODIUM 40 MG PO TBEC
40.0000 mg | DELAYED_RELEASE_TABLET | Freq: Every day | ORAL | Status: DC
  Administered 2018-10-17 – 2018-10-24 (×7): 40 mg via ORAL
  Filled 2018-10-15 (×8): qty 1

## 2018-10-15 MED ORDER — ALLOPURINOL 100 MG PO TABS
300.0000 mg | ORAL_TABLET | Freq: Every morning | ORAL | Status: DC
  Administered 2018-10-17 – 2018-10-24 (×7): 300 mg via ORAL
  Filled 2018-10-15: qty 3
  Filled 2018-10-15: qty 1
  Filled 2018-10-15 (×4): qty 3
  Filled 2018-10-15: qty 1
  Filled 2018-10-15 (×2): qty 3

## 2018-10-15 MED ORDER — PRAVASTATIN SODIUM 10 MG PO TABS: 10.0000 mg | ORAL_TABLET | Freq: Every day | ORAL | Status: DC

## 2018-10-15 MED ORDER — MAGNESIUM OXIDE 400 MG PO TABS
400.0000 mg | ORAL_TABLET | Freq: Every morning | ORAL | Status: DC
  Filled 2018-10-15: qty 1

## 2018-10-15 MED ORDER — HYDRALAZINE HCL 20 MG/ML IJ SOLN
10.0000 mg | Freq: Four times a day (QID) | INTRAMUSCULAR | Status: DC | PRN
  Administered 2018-10-17 – 2018-10-23 (×2): 10 mg via INTRAVENOUS
  Filled 2018-10-15 (×2): qty 1

## 2018-10-15 NOTE — ED Notes (Signed)
Pt passed stroke swallow screen with this RN but pt started to clear his throat during med administration, admitting paged to get swallow eval ordered.

## 2018-10-15 NOTE — Progress Notes (Signed)
  Echocardiogram 2D Echocardiogram has been performed.  [name] 10/15/2018, 5:10 PM

## 2018-10-15 NOTE — Evaluation (Signed)
Physical Therapy Evaluation Patient Details Name: Mason Webb. MRN: [id number] DOB: October 25, 1934 Today's Date: 10/15/2018   History of Present Illness  84 y.o. male with PMH of HTN, HLD and stroke with unknown residual  Deficits  presented for acute onset of confusion and left facial droop as per family; per neurology, non-focal exam, AMS present, but possible not stroke; likely UTI with encephalopathy  Clinical Impression   Pt admitted with above diagnosis. Prior level of function is unknown; Presents to PT with decr functional mobility , generalized weakness; decr independence and safety with mobiltiy;  Pt currently with functional limitations due to the deficits listed below (see PT Problem List). Pt will benefit from skilled PT to increase their independence and safety with mobility to allow discharge to the venue listed below.       Follow Up Recommendations SNF    Equipment Recommendations  Rolling walker with 5" wheels;3in1 (PT)    Recommendations for Other Services       Precautions / Restrictions Precautions Precautions: Fall      Mobility  Bed Mobility Overal bed mobility: Needs Assistance Bed Mobility: Supine to Sit;Sit to Supine     Supine to sit: Mod assist;+2 for safety/equipment Sit to supine: Mod assist;+2 for safety/equipment   General bed mobility comments: Mod assist to help LEs off of EOB and elevate trunk to sit; pt initially resitant to getting up, but relaxed soon after sitting up; assist to lay back down  Transfers Overall transfer level: Needs assistance Equipment used: 2 person hand held assist Transfers: Sit to/from Stand Sit to Stand: Mod assist;+2 safety/equipment         General transfer comment: Mod assist to initiate by holding hands and encouraging pt forward; Light mod asist to pwer up to sit  Ambulation/Gait Ambulation/Gait assistance: Max assist;+2 safety/equipment Gait Distance (Feet): (attemtped sidesteps at eOB) Assistive  device: 2 person hand held assist       General Gait Details: Unable to shift weight to [place] R and/or LLE for stepping  Stairs            Wheelchair Mobility    Modified Rankin (Stroke Patients Only)       Balance Overall balance assessment: Needs assistance   Sitting balance-Leahy Scale: Fair       Standing balance-Leahy Scale: Poor                               Pertinent Vitals/Pain Pain Assessment: No/denies pain    Home Living Family/patient expects to be discharged to:: Unsure                 Additional Comments: Unable to get reliable information from patient; Per RN, daughter calls consistently for updates, but unclear if he lives alone or with family    Prior Function           Comments: PLOF is unclear -- it sounds like he was living alone, and family from [place] found him, helped him to the hospital, and went back to [place]?     Hand Dominance        Extremity/Trunk Assessment   Upper Extremity Assessment Upper Extremity Assessment: Generalized weakness    Lower Extremity Assessment Lower Extremity Assessment: Generalized weakness       Communication   Communication: HOH  Cognition Arousal/Alertness: Awake/alert Behavior During Therapy: Restless Overall Cognitive Status: No family/caregiver present to determine baseline cognitive functioning Area of Impairment:  Orientation;Following commands;Safety/judgement;Problem solving                 Orientation Level: Disoriented to;Place;Time;Situation     Following Commands: Follows one step commands inconsistently Safety/Judgement: Decreased awareness of safety;Decreased awareness of deficits   Problem Solving: Slow processing;Decreased initiation;Difficulty sequencing;Requires verbal cues;Requires tactile cues        General Comments General comments (skin integrity, edema, etc.): incontinent of urine once standing    Exercises     Assessment/Plan     PT Assessment Patient needs continued PT services  PT Problem List Decreased strength;Decreased range of motion;Decreased activity tolerance;Decreased balance;Decreased mobility;Decreased coordination;Decreased cognition;Decreased knowledge of use of DME;Decreased safety awareness;Decreased knowledge of precautions       PT Treatment Interventions DME instruction;Gait training;Stair training;Functional mobility training;Therapeutic activities;Therapeutic exercise;Balance training;Neuromuscular re-education;Cognitive remediation;Patient/family education    PT Goals (Current goals can be found in the Care Plan section)  Acute Rehab PT Goals Patient Stated Goal: did not state PT Goal Formulation: Patient unable to participate in goal setting Time For Goal Achievement: 10/29/18 Potential to Achieve Goals: Fair    Frequency Min 2X/week   Barriers to discharge Decreased caregiver support Need more information re: home situation and any available home assist    Co-evaluation               AM-PAC PT "6 Clicks" Mobility  Outcome Measure Help needed turning from your back to your side while in a flat bed without using bedrails?: A Lot Help needed moving from lying on your back to sitting on the side of a flat bed without using bedrails?: A Lot Help needed moving to and from a bed to a chair (including a wheelchair)?: A Lot Help needed standing up from a chair using your arms (e.g., wheelchair or bedside chair)?: A Lot Help needed to walk in hospital room?: A Lot Help needed climbing 3-5 steps with a railing? : Total 6 Click Score: 11    End of Session Equipment Utilized During Treatment: Gait belt Activity Tolerance: Patient tolerated treatment well Patient left: in bed;with call bell/phone within reach;Other (comment)(with RN and MD present) Nurse Communication: Mobility status PT Visit Diagnosis: Unsteadiness on feet (R26.81);Other abnormalities of gait and mobility  (R26.89);Muscle weakness (generalized) (M62.81)    Time: 0973-5329 PT Time Calculation (min) (ACUTE ONLY): 20 min   Charges:   PT Evaluation $PT Eval Moderate Complexity: 1 Mod          Van Clines, PT  Acute Rehabilitation Services Pager [phone] Office [phone]   Levi Aland 10/15/2018, 5:01 PM

## 2018-10-15 NOTE — ED Notes (Signed)
Skin breakdown noted on patient's sacrum. Sacral heart applied as skin barrier.

## 2018-10-15 NOTE — Progress Notes (Addendum)
NEURO HOSPITALIST PROGRESS NOTE   Subjective: patient in bed asleep, easily arouses to name calling. NAD, oriented to full name only. States he was 84, year is 84, month is July and perseverated after naming 1 object correctly.  Exam: Vitals:   10/15/18 0430 10/15/18 0702  BP: (!) 171/102 (!) 181/95  Pulse: 68 68  Resp: 13   Temp:    SpO2: 100% 99%    Physical Exam   HEENT-  Normocephalic, no lesions, without obvious abnormality.  Normal external eye and conjunctiva.   Cardiovascular- , pulses palpable throughout   Lungs- no excessive working breathing.  Saturations within normal limits on RA Extremities- Warm, dry and intact Musculoskeletal-no joint tenderness, deformity or swelling Skin-warm and dry, no hyperpigmentation, vitiligo, or suspicious lesions   Neuro:  Mental Status: Alert, oriented to name only.   Named 1 object correctly and then began perseverating. Able to follow simple commands. Cranial Nerves: II: blinks to threat bilaterally III,IV, VI: ptosis not present, extra-ocular motions intact bilaterally pupils equal, round, reactive to light and accommodation V,VII: smile asymmetric, left facial droop,  VIII: hearing normal bilaterally IX,X: uvula rises symmetrically XI: bilateral shoulder shrug XII: midline tongue extension Motor: Able to break gravity with BUE. Hand grips equal. BLE not able top break gravity.( questionable effort). Tone and bulk:normal tone throughout; no atrophy noted Sensory: responds to noxious in all 4 extremities.  Cerebellar: Unable to assess Gait: deferred    Medications:  Scheduled: . aspirin  300 mg Rectal Daily   Or  . aspirin  325 mg Oral Daily  . insulin aspart  0-9 Units Subcutaneous Q4H   Continuous: . cefTRIAXone (ROCEPHIN)  IV Stopped (10/15/18 0340)   EGB:[id number] **OR** acetaminophen (TYLENOL) oral liquid 160 mg/5 mL **OR** acetaminophen, iohexol  Pertinent Labs/Diagnostics:   Mr  Angio Head Wo Contrast  Result Date: 10/15/2018 CLINICAL DATA:  Encephalopathy EXAM: MRI HEAD WITHOUT CONTRAST MRA HEAD WITHOUT CONTRAST TECHNIQUE: Multiplanar, multiecho pulse sequences of the brain and surrounding structures were obtained without intravenous contrast. Angiographic images of the head were obtained using MRA technique without contrast. COMPARISON:  Head CT 10/14/2018 FINDINGS: MRI HEAD FINDINGS BRAIN: There is no acute infarct, acute hemorrhage or extra-axial collection. Early confluent hyperintense T2-weighted signal of the periventricular and deep white matter, most commonly due to chronic ischemic microangiopathy. Generalized advanced atrophy. No hydrocephalus. The midline structures are normal. There are multiple old small vessel infarcts. VASCULAR: The major intracranial arterial and venous sinus flow voids are normal. Susceptibility weighted imaging shows multiple old microhemorrhages as well as sequelae of prior hemorrhagic events in the deep gray nuclei. SKULL AND UPPER CERVICAL SPINE: Calvarial bone marrow signal is normal. There is no skull base mass. The visualized upper cervical spine and soft tissues are normal. SINUSES/ORBITS: There are no fluid levels or advanced mucosal thickening. The mastoid air cells and middle ear cavities are free of fluid. The orbits are normal. MRA HEAD FINDINGS POSTERIOR CIRCULATION: --Vertebral arteries: Normal V4 segments. --Posterior inferior cerebellar arteries (PICA): Patent origins from the vertebral arteries. --Anterior inferior cerebellar arteries (AICA): Patent origins from the basilar artery. --Basilar artery: Normal. --Superior cerebellar arteries: Normal. --Posterior cerebral arteries: Normal. The left PCA is partially supplied by a posterior communicating artery (p-comm). ANTERIOR CIRCULATION: --Intracranial internal carotid arteries: Normal. --Anterior cerebral arteries (ACA): Normal. Both A1 segments are present. Patent anterior communicating  artery (a-comm). --Middle  cerebral arteries (MCA): Normal. IMPRESSION: 1. No acute intracranial abnormality. 2. Generalized advanced atrophy and chronic ischemic microangiopathy. 3. Multiple old infarcts and chronic microhemorrhages. 4. Normal MRA of the Circle of Willis. Electronically Signed   By: Ulyses Jarred M.D.   On: 10/15/2018 01:20   Mr Brain Wo Contrast  Result Date: 10/15/2018 CLINICAL DATA:  Encephalopathy EXAM: MRI HEAD WITHOUT CONTRAST MRA HEAD WITHOUT CONTRAST TECHNIQUE: Multiplanar, multiecho pulse sequences of the brain and surrounding structures were obtained without intravenous contrast. Angiographic images of the head were obtained using MRA technique without contrast. COMPARISON:  Head CT 10/14/2018 FINDINGS: MRI HEAD FINDINGS BRAIN: There is no acute infarct, acute hemorrhage or extra-axial collection. Early confluent hyperintense T2-weighted signal of the periventricular and deep white matter, most commonly due to chronic ischemic microangiopathy. Generalized advanced atrophy. No hydrocephalus. The midline structures are normal. There are multiple old small vessel infarcts. VASCULAR: The major intracranial arterial and venous sinus flow voids are normal. Susceptibility weighted imaging shows multiple old microhemorrhages as well as sequelae of prior hemorrhagic events in the deep gray nuclei. SKULL AND UPPER CERVICAL SPINE: Calvarial bone marrow signal is normal. There is no skull base mass. The visualized upper cervical spine and soft tissues are normal. SINUSES/ORBITS: There are no fluid levels or advanced mucosal thickening. The mastoid air cells and middle ear cavities are free of fluid. The orbits are normal. MRA HEAD FINDINGS POSTERIOR CIRCULATION: --Vertebral arteries: Normal V4 segments. --Posterior inferior cerebellar arteries (PICA): Patent origins from the vertebral arteries. --Anterior inferior cerebellar arteries (AICA): Patent origins from the basilar artery. --Basilar  artery: Normal. --Superior cerebellar arteries: Normal. --Posterior cerebral arteries: Normal. The left PCA is partially supplied by a posterior communicating artery (p-comm). ANTERIOR CIRCULATION: --Intracranial internal carotid arteries: Normal. --Anterior cerebral arteries (ACA): Normal. Both A1 segments are present. Patent anterior communicating artery (a-comm). --Middle cerebral arteries (MCA): Normal. IMPRESSION: 1. No acute intracranial abnormality. 2. Generalized advanced atrophy and chronic ischemic microangiopathy. 3. Multiple old infarcts and chronic microhemorrhages. 4. Normal MRA of the Circle of Willis. Electronically Signed   By: Ulyses Jarred M.D.   On: 10/15/2018 01:20   Dg Chest Port 1 View  Result Date: 10/14/2018 CLINICAL DATA:  84 year old male with history of weakness. Left-sided neglect and left facial droop. EXAM: PORTABLE CHEST 1 VIEW COMPARISON:  Chest x-ray 07/24/2018. FINDINGS: Lung volumes are low. No consolidative airspace disease. No pleural effusions. No pneumothorax. No pulmonary nodule or mass noted. Pulmonary vasculature and the cardiomediastinal silhouette are within normal limits. IMPRESSION: 1. Low lung volumes without radiographic evidence acute cardiopulmonary disease. Electronically Signed   By: Vinnie Langton M.D.   On: 10/14/2018 20:44   Ct Head Code Stroke Wo Contrast  Result Date: 10/14/2018 CLINICAL DATA:  Code stroke. Focal neuro deficit, less than 6 hours, stroke suspected. Additional history provided: Left-sided neglect, left facial droop. EXAM: CT HEAD WITHOUT CONTRAST TECHNIQUE: Contiguous axial images were obtained from the base of the skull through the vertex without intravenous contrast. COMPARISON:  No pertinent prior studies available for comparison. FINDINGS: Brain: No evidence of acute intracranial hemorrhage. No acute demarcated cortical infarction identified. Ill-defined hypoattenuation of the cerebral white matter is nonspecific, but consistent  with chronic small vessel ischemic disease. Chronic appearing lacunar infarcts within the left corona radiata/internal capsule and within the left thalamus. Age-indeterminate lacunar infarct within the left caudate nucleus. No evidence of intracranial mass. No midline shift or extra-axial fluid collection. Moderate generalized parenchymal atrophy. Vascular: No hyperdense vessel. Skull: No calvarial  fracture Sinuses/Orbits: Visualized orbits demonstrate no acute abnormality. Mucosal thickening within the partially imaged left maxillary sinus. Trace left mastoid effusion. ASPECTS [facility]([place] Stroke Program Early CT Score) - Ganglionic level infarction (caudate, lentiform nuclei, internal capsule, insula, M1-M3 cortex): 7 - Supraganglionic infarction (M4-M6 cortex): 3 Total score (0-10 with 10 being normal): 10 (in the right MCA vascular territory). These results were called by telephone at the time of interpretation on 10/14/2018 at 5:57 pm to provider Dr. Roda ShuttersXu, who verbally acknowledged these results. IMPRESSION: No evidence of intracranial hemorrhage or acute demarcated cortical infarction. Age-indeterminate lacunar infarct within the left caudate. Chronic lacunar infarcts within the left corona radiata/internal capsule and left thalamus. Generalized parenchymal atrophy with chronic small vessel ischemic disease. Electronically Signed   By: Jackey LogeKyle  Golden   On: 10/14/2018 18:07    UDS: normal UA: + UTI Ammonia: 53 B12:WNL LDL: 79  Valentina LucksJessica Williams, MSN, NP-C [facility] [phone]  Attending neurologist's note to follow    10/15/2018, 9:12 AM    I have seen the patient reviewed the above note.  On exam he seems mildly encephalopathic, but better than what was documented previously.  He is able to tell me his name and that he is in the hospital.  He is not able to tell me the month or the year, answering July.  Assessment:  84 y.o. male with encephalopathy in the setting of UTI and  hyperammonemia.  I suspect that any focal symptoms represented recrudescence of previous symptoms in the setting of his encephalopathy rather than new onset vascular event.  I favor treating him for his underlying medical conditions and would expect him to gradually improve.  Recommendations:  1) consider more aggressive statin therapy given that he does have a history of stroke and his LDL is currently 79 2) ASA 81 mg daily for secondary stroke prevention 3) treatment of underlying UTI and hyperammonemia   4) please call if further questions or concerns remain.   Ritta SlotMcNeill Rivers Hamrick, MD [facility] [phone]  If 7pm- 7am, please page neurology on call as listed in AMION.

## 2018-10-15 NOTE — Progress Notes (Signed)
PROGRESS NOTE    Mason Webb.   ZOX:[id number]  DOB: 08-29-1934  DOA: 10/14/2018 PCP: Zoila Shutter, MD   Brief Narrative:  Mason Webb.  is a 84 y.o. male, w hypertension, hyperlipidemia, ? Dm2, Cad , apparently presents with altered mental status , confusion, and ? Left facial droop and left upper extremity neglect.  Noted to have a fever of 100.3 in ED and a + UA. Started on Ceftriaxone and admitted to r/o CVA  Subjective: Non verbal     Assessment & Plan:   Principal Problem:   Altered mental status - MRI neg for CVA- symptoms may be related to UTI- will need to continue to follow for improvement - Neuro did not feel he had a TIA-  - he did choke on meds and is awaiting an SLP eval  - PT recommends SNF- will start SNF search-not safe to go home today  Active Problems:   Acute lower UTI - causing above - cont Ceftriaxone- f/u culture- as he is not mentally back to baseline, will need to stay in the hospital    Hypertension - cont Antihypertensives    Hyperlipidemia - cont Statin  Time spent in minutes: 35 DVT prophylaxis: SCDs Code Status: Full code Family Communication:  Disposition Plan: f/u SNF search, mental status and SLP eval Consultants:   neuro Procedures:    Antimicrobials:  Anti-infectives (From admission, onward)   Start     Dose/Rate Route Frequency Ordered Stop   10/15/18 0300  cefTRIAXone (ROCEPHIN) 1 g in sodium chloride 0.9 % 100 mL IVPB     1 g 200 mL/hr over 30 Minutes Intravenous Every 24 hours 10/15/18 0207     10/14/18 2000  vancomycin (VANCOCIN) 1,500 mg in sodium chloride 0.9 % 500 mL IVPB     1,500 mg 250 mL/hr over 120 Minutes Intravenous  Once 10/14/18 1913 10/15/18 0052   10/14/18 1900  ceFEPIme (MAXIPIME) 2 g in sodium chloride 0.9 % 100 mL IVPB  Status:  Discontinued     2 g 200 mL/hr over 30 Minutes Intravenous  Once 10/14/18 1858 10/15/18 0220   10/14/18 1900  metroNIDAZOLE (FLAGYL) IVPB 500 mg     500  mg 100 mL/hr over 60 Minutes Intravenous  Once 10/14/18 1858 10/14/18 2213   10/14/18 1900  vancomycin (VANCOCIN) IVPB 1000 mg/200 mL premix  Status:  Discontinued     1,000 mg 200 mL/hr over 60 Minutes Intravenous  Once 10/14/18 1858 10/14/18 1913       Objective: Vitals:   10/15/18 1224 10/15/18 1400 10/15/18 1512 10/15/18 1629  BP: 126/73 127/76 (!) 157/91 (!) 135/96  Pulse: 70 69 73 74  Resp: Temp: 98.7 F (37.1 C)     TempSrc: Oral     SpO2: 100% 100% 100% 97%    Intake/Output Summary (Last 24 hours) at 10/15/2018 1748 Last data filed at 10/15/2018 0704 Gross per 24 hour  Intake --  Output 1800 ml  Net -1800 ml   There were no vitals filed for this visit.  Examination: General exam: Appears comfortable  HEENT: PERRLA, oral mucosa moist, no sclera icterus or thrush Respiratory system: Clear to auscultation. Respiratory effort normal. Cardiovascular system: S1 & S2 heard, RRR.   Gastrointestinal system: Abdomen soft, non-tender, nondistended. Normal bowel sounds. Central nervous system: Alert - non verbal- doesn't follow commands- moves independentaly. No focal neurological deficits. Extremities: No cyanosis, clubbing or edema Skin: No rashes or ulcers  Data Reviewed: I have personally reviewed following labs and imaging studies  CBC: Recent Labs  Lab 10/14/18 1737 10/14/18 1739 10/15/18 0243  WBC 9.0  --  10.3  NEUTROABS 7.2  --   --   HGB 10.8* 12.2* 10.0*  HCT 32.6* 36.0* 31.2*  MCV 93.1  --  93.4  PLT 320  --  485   Basic Metabolic Panel: Recent Labs  Lab 10/14/18 1737 10/14/18 1739 10/15/18 0243  NA 143 142 142  K 4.1 4.0 3.9  CL 105 105 104  CO2 27  --  27  GLUCOSE 131* 122* 114*  BUN 15 20 14   CREATININE 0.96 0.80 0.83  CALCIUM 9.2  --  8.8*   GFR: CrCl cannot be calculated (Unknown ideal weight.). Liver Function Tests: Recent Labs  Lab 10/14/18 1737 10/15/18 0243  AST 18 14*  ALT 12 7  ALKPHOS 50 51  BILITOT  0.7 0.4  PROT 7.3 6.5  ALBUMIN 3.2* 2.6*   No results for input(s): LIPASE, AMYLASE in the last 168 hours. Recent Labs  Lab 10/14/18 1848  AMMONIA 53*   Coagulation Profile: Recent Labs  Lab 10/14/18 1737  INR 1.0   Cardiac Enzymes: No results for input(s): CKTOTAL, CKMB, CKMBINDEX, TROPONINI in the last 168 hours. BNP (last 3 results) No results for input(s): PROBNP in the last 8760 hours. HbA1C: Recent Labs    10/15/18 0242  HGBA1C 5.5   CBG: Recent Labs  Lab 10/15/18 0139 10/15/18 0443 10/15/18 0742 10/15/18 1226 10/15/18 1617  GLUCAP 121* 106* 95 81 81   Lipid Profile: Recent Labs    10/15/18 0242  CHOL 138  HDL 47  LDLCALC 79  TRIG 61  CHOLHDL 2.9   Thyroid Function Tests: Recent Labs    10/14/18 1958  TSH 2.618   Anemia Panel: Recent Labs    10/14/18 1958  VITAMINB12 530   Urine analysis:    Component Value Date/Time   COLORURINE YELLOW 10/14/2018 2105   APPEARANCEUR CLOUDY (A) 10/14/2018 2105   LABSPEC 1.016 10/14/2018 2105   PHURINE 8.0 10/14/2018 2105   GLUCOSEU NEGATIVE 10/14/2018 2105   HGBUR NEGATIVE 10/14/2018 2105   [place] NEGATIVE 10/14/2018 2105   [place] NEGATIVE 10/14/2018 2105   PROTEINUR 30 (A) 10/14/2018 2105   NITRITE POSITIVE (A) 10/14/2018 2105   LEUKOCYTESUR LARGE (A) 10/14/2018 2105   Sepsis Labs: @LABRCNTIP (procalcitonin:4,lacticidven:4) ) Recent Results (from the past 240 hour(s))  SARS Coronavirus 2 [facility] order, Performed in [facility] hospital lab) Nasopharyngeal Nasopharyngeal Swab     Status: None   Collection Time: 10/14/18  6:55 PM   Specimen: Nasopharyngeal Swab  Result Value Ref Range Status   SARS Coronavirus 2 NEGATIVE NEGATIVE Final    Comment: (NOTE) If result is NEGATIVE SARS-CoV-2 target nucleic acids are NOT DETECTED. The SARS-CoV-2 RNA is generally detectable in upper and lower  respiratory specimens during the acute phase of infection. The lowest  concentration of SARS-CoV-2  viral copies this assay can detect is 250  copies / mL. A negative result does not preclude SARS-CoV-2 infection  and should not be used as the sole basis for treatment or other  patient management decisions.  A negative result may occur with  improper specimen collection / handling, submission of specimen other  than nasopharyngeal swab, presence of viral mutation(s) within the  areas targeted by this assay, and inadequate number of viral copies  (<250 copies / mL). A negative result must be combined with clinical  observations, patient  history, and epidemiological information. If result is POSITIVE SARS-CoV-2 target nucleic acids are DETECTED. The SARS-CoV-2 RNA is generally detectable in upper and lower  respiratory specimens dur ing the acute phase of infection.  Positive  results are indicative of active infection with SARS-CoV-2.  Clinical  correlation with patient history and other diagnostic information is  necessary to determine patient infection status.  Positive results do  not rule out bacterial infection or co-infection with other viruses. If result is PRESUMPTIVE POSTIVE SARS-CoV-2 nucleic acids MAY BE PRESENT.   A presumptive positive result was obtained on the submitted specimen  and confirmed on repeat testing.  While 2019 novel coronavirus  (SARS-CoV-2) nucleic acids may be present in the submitted sample  additional confirmatory testing may be necessary for epidemiological  and / or clinical management purposes  to differentiate between  SARS-CoV-2 and other Sarbecovirus currently known to infect humans.  If clinically indicated additional testing with an alternate test  methodology [phone]) is advised. The SARS-CoV-2 RNA is generally  detectable in upper and lower respiratory sp ecimens during the acute  phase of infection. The expected result is Negative. Fact Sheet for Patients:  BoilerBrush.com.cy Fact Sheet for Healthcare  Providers: https://pope.com/ This test is not yet approved or cleared by the [place] FDA and has been authorized for detection and/or diagnosis of SARS-CoV-2 by FDA under an Emergency Use Authorization (EUA).  This EUA will remain in effect (meaning this test can be used) for the duration of the COVID-19 declaration under Section 564(b)(1) of the Act, 21 U.S.C. section 360bbb-3(b)(1), unless the authorization is terminated or revoked sooner. Performed at [facility], [address], [place], [place] [address]   Blood Culture (routine x 2)     Status: None (Preliminary result)   Collection Time: 10/14/18  7:50 PM   Specimen: BLOOD  Result Value Ref Range Status   Specimen Description BLOOD RIGHT NECK  Final   Special Requests   Final    BOTTLES DRAWN AEROBIC AND ANAEROBIC Blood Culture results may not be optimal due to an inadequate volume of blood received in culture bottles   Culture   Final    NO GROWTH < 24 HOURS Performed at [facility], [address], [place], [place] [address]    Report Status PENDING  Incomplete         Radiology Studies: Mr Angio Head Wo Contrast  Result Date: 10/15/2018 CLINICAL DATA:  Encephalopathy EXAM: MRI HEAD WITHOUT CONTRAST MRA HEAD WITHOUT CONTRAST TECHNIQUE: Multiplanar, multiecho pulse sequences of the brain and surrounding structures were obtained without intravenous contrast. Angiographic images of the head were obtained using MRA technique without contrast. COMPARISON:  Head CT 10/14/2018 FINDINGS: MRI HEAD FINDINGS BRAIN: There is no acute infarct, acute hemorrhage or extra-axial collection. Early confluent hyperintense T2-weighted signal of the periventricular and deep white matter, most commonly due to chronic ischemic microangiopathy. Generalized advanced atrophy. No hydrocephalus. The midline structures are normal. There are multiple old small vessel infarcts. VASCULAR: The major intracranial  arterial and venous sinus flow voids are normal. Susceptibility weighted imaging shows multiple old microhemorrhages as well as sequelae of prior hemorrhagic events in the deep gray nuclei. SKULL AND UPPER CERVICAL SPINE: Calvarial bone marrow signal is normal. There is no skull base mass. The visualized upper cervical spine and soft tissues are normal. SINUSES/ORBITS: There are no fluid levels or advanced mucosal thickening. The mastoid air cells and middle ear cavities are free of fluid. The orbits  are normal. MRA HEAD FINDINGS POSTERIOR CIRCULATION: --Vertebral arteries: Normal V4 segments. --Posterior inferior cerebellar arteries (PICA): Patent origins from the vertebral arteries. --Anterior inferior cerebellar arteries (AICA): Patent origins from the basilar artery. --Basilar artery: Normal. --Superior cerebellar arteries: Normal. --Posterior cerebral arteries: Normal. The left PCA is partially supplied by a posterior communicating artery (p-comm). ANTERIOR CIRCULATION: --Intracranial internal carotid arteries: Normal. --Anterior cerebral arteries (ACA): Normal. Both A1 segments are present. Patent anterior communicating artery (a-comm). --Middle cerebral arteries (MCA): Normal. IMPRESSION: 1. No acute intracranial abnormality. 2. Generalized advanced atrophy and chronic ischemic microangiopathy. 3. Multiple old infarcts and chronic microhemorrhages. 4. Normal MRA of the Circle of Willis. Electronically Signed   By: Deatra RobinsonKevin  Herman M.D.   On: 10/15/2018 01:20   Mr Brain Wo Contrast  Result Date: 10/15/2018 CLINICAL DATA:  Encephalopathy EXAM: MRI HEAD WITHOUT CONTRAST MRA HEAD WITHOUT CONTRAST TECHNIQUE: Multiplanar, multiecho pulse sequences of the brain and surrounding structures were obtained without intravenous contrast. Angiographic images of the head were obtained using MRA technique without contrast. COMPARISON:  Head CT 10/14/2018 FINDINGS: MRI HEAD FINDINGS BRAIN: There is no acute infarct, acute  hemorrhage or extra-axial collection. Early confluent hyperintense T2-weighted signal of the periventricular and deep white matter, most commonly due to chronic ischemic microangiopathy. Generalized advanced atrophy. No hydrocephalus. The midline structures are normal. There are multiple old small vessel infarcts. VASCULAR: The major intracranial arterial and venous sinus flow voids are normal. Susceptibility weighted imaging shows multiple old microhemorrhages as well as sequelae of prior hemorrhagic events in the deep gray nuclei. SKULL AND UPPER CERVICAL SPINE: Calvarial bone marrow signal is normal. There is no skull base mass. The visualized upper cervical spine and soft tissues are normal. SINUSES/ORBITS: There are no fluid levels or advanced mucosal thickening. The mastoid air cells and middle ear cavities are free of fluid. The orbits are normal. MRA HEAD FINDINGS POSTERIOR CIRCULATION: --Vertebral arteries: Normal V4 segments. --Posterior inferior cerebellar arteries (PICA): Patent origins from the vertebral arteries. --Anterior inferior cerebellar arteries (AICA): Patent origins from the basilar artery. --Basilar artery: Normal. --Superior cerebellar arteries: Normal. --Posterior cerebral arteries: Normal. The left PCA is partially supplied by a posterior communicating artery (p-comm). ANTERIOR CIRCULATION: --Intracranial internal carotid arteries: Normal. --Anterior cerebral arteries (ACA): Normal. Both A1 segments are present. Patent anterior communicating artery (a-comm). --Middle cerebral arteries (MCA): Normal. IMPRESSION: 1. No acute intracranial abnormality. 2. Generalized advanced atrophy and chronic ischemic microangiopathy. 3. Multiple old infarcts and chronic microhemorrhages. 4. Normal MRA of the Circle of Willis. Electronically Signed   By: Deatra RobinsonKevin  Herman M.D.   On: 10/15/2018 01:20   Dg Chest Port 1 View  Result Date: 10/14/2018 CLINICAL DATA:  84 year old male with history of weakness.  Left-sided neglect and left facial droop. EXAM: PORTABLE CHEST 1 VIEW COMPARISON:  Chest x-ray 07/24/2018. FINDINGS: Lung volumes are low. No consolidative airspace disease. No pleural effusions. No pneumothorax. No pulmonary nodule or mass noted. Pulmonary vasculature and the cardiomediastinal silhouette are within normal limits. IMPRESSION: 1. Low lung volumes without radiographic evidence acute cardiopulmonary disease. Electronically Signed   By: Trudie Reedaniel  Entrikin M.D.   On: 10/14/2018 20:44   Ct Head Code Stroke Wo Contrast  Result Date: 10/14/2018 CLINICAL DATA:  Code stroke. Focal neuro deficit, less than 6 hours, stroke suspected. Additional history provided: Left-sided neglect, left facial droop. EXAM: CT HEAD WITHOUT CONTRAST TECHNIQUE: Contiguous axial images were obtained from the base of the skull through the vertex without intravenous contrast. COMPARISON:  No pertinent prior studies  available for comparison. FINDINGS: Brain: No evidence of acute intracranial hemorrhage. No acute demarcated cortical infarction identified. Ill-defined hypoattenuation of the cerebral white matter is nonspecific, but consistent with chronic small vessel ischemic disease. Chronic appearing lacunar infarcts within the left corona radiata/internal capsule and within the left thalamus. Age-indeterminate lacunar infarct within the left caudate nucleus. No evidence of intracranial mass. No midline shift or extra-axial fluid collection. Moderate generalized parenchymal atrophy. Vascular: No hyperdense vessel. Skull: No calvarial fracture Sinuses/Orbits: Visualized orbits demonstrate no acute abnormality. Mucosal thickening within the partially imaged left maxillary sinus. Trace left mastoid effusion. ASPECTS [facility] Stroke Program Early CT Score) - Ganglionic level infarction (caudate, lentiform nuclei, internal capsule, insula, M1-M3 cortex): 7 - Supraganglionic infarction (M4-M6 cortex): 3 Total score (0-10 with 10 being  normal): 10 (in the right MCA vascular territory). These results were called by telephone at the time of interpretation on 10/14/2018 at 5:57 pm to provider Dr. Roda Shutters, who verbally acknowledged these results. IMPRESSION: No evidence of intracranial hemorrhage or acute demarcated cortical infarction. Age-indeterminate lacunar infarct within the left caudate. Chronic lacunar infarcts within the left corona radiata/internal capsule and left thalamus. Generalized parenchymal atrophy with chronic small vessel ischemic disease. Electronically Signed   By: Jackey Loge   On: 10/14/2018 18:07      Scheduled Meds:  allopurinol  300 mg Oral q morning - 10a   amLODipine  2.5 mg Oral q morning - 10a   aspirin  300 mg Rectal Daily   Or   aspirin  325 mg Oral Daily   furosemide  20 mg Oral q morning - 10a   hydrALAZINE  50 mg Oral BID   insulin aspart  0-9 Units Subcutaneous Q4H   losartan  25 mg Oral q morning - 10a   magnesium oxide  400 mg Oral Daily   metoprolol succinate  25 mg Oral q morning - 10a   pantoprazole  40 mg Oral Daily   pravastatin  10 mg Oral QHS   Continuous Infusions:  cefTRIAXone (ROCEPHIN)  IV Stopped (10/15/18 0340)     LOS: 0 days      Calvert Cantor, MD [facility] Pager: [email or web address] Password [email or web address] 10/15/2018, 5:48 PM

## 2018-10-16 DIAGNOSIS — N3000 Acute cystitis without hematuria: Secondary | ICD-10-CM

## 2018-10-16 LAB — CBG MONITORING, ED
Glucose-Capillary: 71 mg/dL (ref 70–99)
Glucose-Capillary: 74 mg/dL (ref 70–99)
Glucose-Capillary: 85 mg/dL (ref 70–99)
Glucose-Capillary: 88 mg/dL (ref 70–99)
Glucose-Capillary: 91 mg/dL (ref 70–99)

## 2018-10-16 LAB — GLUCOSE, CAPILLARY
Glucose-Capillary: 114 mg/dL — ABNORMAL HIGH (ref 70–99)
Glucose-Capillary: 111 mg/dL — ABNORMAL HIGH (ref 70–99)

## 2018-10-16 LAB — ECHOCARDIOGRAM COMPLETE

## 2018-10-16 MED ORDER — DEXTROSE-NACL 5-0.9 % IV SOLN
INTRAVENOUS | Status: DC
  Administered 2018-10-16 – 2018-10-17 (×3): via INTRAVENOUS

## 2018-10-16 MED ORDER — METOPROLOL TARTRATE 5 MG/5ML IV SOLN
5.0000 mg | Freq: Once | INTRAVENOUS | Status: AC
  Administered 2018-10-16: 10:00:00 5 mg via INTRAVENOUS
  Filled 2018-10-16: qty 5

## 2018-10-16 MED ORDER — METOPROLOL TARTRATE 5 MG/5ML IV SOLN: 5.0000 mg | Freq: Four times a day (QID) | INTRAVENOUS | Status: DC | PRN

## 2018-10-16 NOTE — ED Notes (Signed)
Lannette Donath, RN on 3W given report.

## 2018-10-16 NOTE — ED Notes (Signed)
Pt has 3 stage 2 pressure ulcer on coccyx/buttocks area 0.8-1cm in sizes.

## 2018-10-16 NOTE — ED Notes (Signed)
Ordered bfast 

## 2018-10-16 NOTE — ED Notes (Signed)
Attempted to give report and was told nurse is in a room with another pt and that she will call back.

## 2018-10-16 NOTE — ED Notes (Signed)
Received in report that Rn repeated swallow screen and that pt passed after taking each pill slowly and one at a time

## 2018-10-16 NOTE — Progress Notes (Signed)
PROGRESS NOTE    Bjorn Loser.   ZOX:[id number]  DOB: August 10, 1934  DOA: 10/14/2018 PCP: Zoila Shutter, MD   Brief Narrative:  Selmer Adduci.  is a 84 y.o. male, w hypertension, hyperlipidemia, ? Dm2, Cad , apparently presents with altered mental status , confusion, and ? Left facial droop and left upper extremity neglect.  Noted to have a fever of 100.3 in ED and a + UA. Started on Ceftriaxone and admitted to r/o CVA  Subjective: He has no complaints today.     Assessment & Plan:   Principal Problem:   Altered mental status - MRI neg for CVA- symptoms may be related to UTI- will need to continue to follow for improvement - Neuro did not feel he had a TIA-  - he did choke on meds and is still awaiting an SLP eval - on INF while NPO - PT recommends SNF- will start SNF search-   Active Problems:   Acute lower UTI - causing above - cont Ceftriaxone-  culture growing proteus mirabilis - mental status has improved today and he is no longer confused    Hypertension - cont Antihypertensives    Hyperlipidemia - cont Statin  Time spent in minutes: 35 DVT prophylaxis: SCDs Code Status: Full code Family Communication:  Disposition Plan: f/u SNF search, and SLP eval Consultants:   neuro Procedures:    Antimicrobials:  Anti-infectives (From admission, onward)   Start     Dose/Rate Route Frequency Ordered Stop   10/15/18 0300  cefTRIAXone (ROCEPHIN) 1 g in sodium chloride 0.9 % 100 mL IVPB     1 g 200 mL/hr over 30 Minutes Intravenous Every 24 hours 10/15/18 0207     10/14/18 2000  vancomycin (VANCOCIN) 1,500 mg in sodium chloride 0.9 % 500 mL IVPB     1,500 mg 250 mL/hr over 120 Minutes Intravenous  Once 10/14/18 1913 10/15/18 0052   10/14/18 1900  ceFEPIme (MAXIPIME) 2 g in sodium chloride 0.9 % 100 mL IVPB  Status:  Discontinued     2 g 200 mL/hr over 30 Minutes Intravenous  Once 10/14/18 1858 10/15/18 0220   10/14/18 1900  metroNIDAZOLE (FLAGYL) IVPB 500  mg     500 mg 100 mL/hr over 60 Minutes Intravenous  Once 10/14/18 1858 10/14/18 2213   10/14/18 1900  vancomycin (VANCOCIN) IVPB 1000 mg/200 mL premix  Status:  Discontinued     1,000 mg 200 mL/hr over 60 Minutes Intravenous  Once 10/14/18 1858 10/14/18 1913       Objective: Vitals:   10/16/18 0955 10/16/18 1053 10/16/18 1450 10/16/18 1539  BP: (!) 161/103 (!) 177/108 (!) 171/100 (!) 198/92  Pulse: 82 67 63 75  Resp:   14 20  Temp:   97.7 F (36.5 C) 97.7 F (36.5 C)  TempSrc:   Oral Oral  SpO2: 100% 100% 97% 99%   No intake or output data in the 24 hours ending 10/16/18 1607 There were no vitals filed for this visit.  Examination: General exam: Appears comfortable  HEENT: PERRLA, oral mucosa moist, no sclera icterus or thrush Respiratory system: Clear to auscultation. Respiratory effort normal. Cardiovascular system: S1 & S2 heard,  No murmurs  Gastrointestinal system: Abdomen soft, non-tender, nondistended. Normal bowel sounds   Central nervous system: Alert and oriented. No focal neurological deficits. Extremities: No cyanosis, clubbing or edema Skin: No rashes or ulcers Psychiatry:  Mood & affect appropriate.   Data Reviewed: I have personally reviewed following labs and  imaging studies  CBC: Recent Labs  Lab 10/14/18 1737 10/14/18 1739 10/15/18 0243  WBC 9.0  --  10.3  NEUTROABS 7.2  --   --   HGB 10.8* 12.2* 10.0*  HCT 32.6* 36.0* 31.2*  MCV 93.1  --  93.4  PLT 320  --  276   Basic Metabolic Panel: Recent Labs  Lab 10/14/18 1737 10/14/18 1739 10/15/18 0243  NA 143 142 142  K 4.1 4.0 3.9  CL 105 105 104  CO2 27  --  27  GLUCOSE 131* 122* 114*  BUN CREATININE 0.96 0.80 0.83  CALCIUM 9.2  --  8.8*   GFR: CrCl cannot be calculated (Unknown ideal weight.). Liver Function Tests: Recent Labs  Lab 10/14/18 1737 10/15/18 0243  AST 18 14*  ALT 12 7  ALKPHOS 50 51  BILITOT 0.7 0.4  PROT 7.3 6.5  ALBUMIN 3.2* 2.6*   No results for  input(s): LIPASE, AMYLASE in the last 168 hours. Recent Labs  Lab 10/14/18 1848  AMMONIA 53*   Coagulation Profile: Recent Labs  Lab 10/14/18 1737  INR 1.0   Cardiac Enzymes: No results for input(s): CKTOTAL, CKMB, CKMBINDEX, TROPONINI in the last 168 hours. BNP (last 3 results) No results for input(s): PROBNP in the last 8760 hours. HbA1C: Recent Labs    10/15/18 0242  HGBA1C 5.5   CBG: Recent Labs  Lab 10/16/18 0245 10/16/18 0432 10/16/18 0841 10/16/18 1157 10/16/18 1555  GLUCAP 88 85 74 91 114*   Lipid Profile: Recent Labs    10/15/18 0242  CHOL 138  HDL 47  LDLCALC 79  TRIG 61  CHOLHDL 2.9   Thyroid Function Tests: Recent Labs    10/14/18 1958  TSH 2.618   Anemia Panel: Recent Labs    10/14/18 1958  VITAMINB12 530   Urine analysis:    Component Value Date/Time   COLORURINE YELLOW 10/14/2018 2105   APPEARANCEUR CLOUDY (A) 10/14/2018 2105   LABSPEC 1.016 10/14/2018 2105   PHURINE 8.0 10/14/2018 2105   GLUCOSEU NEGATIVE 10/14/2018 2105   HGBUR NEGATIVE 10/14/2018 2105   BILIRUBINUR NEGATIVE 10/14/2018 2105   KETONESUR NEGATIVE 10/14/2018 2105   PROTEINUR 30 (A) 10/14/2018 2105   NITRITE POSITIVE (A) 10/14/2018 2105   LEUKOCYTESUR LARGE (A) 10/14/2018 2105   Sepsis Labs: (procalcitonin:4,lacticidven:4) ) Recent Results (from the past 240 hour(s))  SARS Coronavirus 2 [facility] order, Performed in [facility] hospital lab) Nasopharyngeal Nasopharyngeal Swab     Status: None   Collection Time: 10/14/18  6:55 PM   Specimen: Nasopharyngeal Swab  Result Value Ref Range Status   SARS Coronavirus 2 NEGATIVE NEGATIVE Final    Comment: (NOTE) If result is NEGATIVE SARS-CoV-2 target nucleic acids are NOT DETECTED. The SARS-CoV-2 RNA is generally detectable in upper and lower  respiratory specimens during the acute phase of infection. The lowest  concentration of SARS-CoV-2 viral copies this assay can detect is 250  copies / mL. A  negative result does not preclude SARS-CoV-2 infection  and should not be used as the sole basis for treatment or other  patient management decisions.  A negative result may occur with  improper specimen collection / handling, submission of specimen other  than nasopharyngeal swab, presence of viral mutation(s) within the  areas targeted by this assay, and inadequate number of viral copies  (<250 copies / mL). A negative result must be combined with clinical  observations, patient history, and epidemiological information. If result is POSITIVE SARS-CoV-2  target nucleic acids are DETECTED. The SARS-CoV-2 RNA is generally detectable in upper and lower  respiratory specimens dur ing the acute phase of infection.  Positive  results are indicative of active infection with SARS-CoV-2.  Clinical  correlation with patient history and other diagnostic information is  necessary to determine patient infection status.  Positive results do  not rule out bacterial infection or co-infection with other viruses. If result is PRESUMPTIVE POSTIVE SARS-CoV-2 nucleic acids MAY BE PRESENT.   A presumptive positive result was obtained on the submitted specimen  and confirmed on repeat testing.  While 2019 novel coronavirus  (SARS-CoV-2) nucleic acids may be present in the submitted sample  additional confirmatory testing may be necessary for epidemiological  and / or clinical management purposes  to differentiate between  SARS-CoV-2 and other Sarbecovirus currently known to infect humans.  If clinically indicated additional testing with an alternate test  methodology [phone]([id number]) is advised. The SARS-CoV-2 RNA is generally  detectable in upper and lower respiratory sp ecimens during the acute  phase of infection. The expected result is Negative. Fact Sheet for Patients:  BoilerBrush.com.cyhttps://www.fda.gov/media/136312/download Fact Sheet for Healthcare Providers: https://pope.com/https://www.fda.gov/media/136313/download This test is not  yet approved or cleared by the [place] FDA and has been authorized for detection and/or diagnosis of SARS-CoV-2 by FDA under an Emergency Use Authorization (EUA).  This EUA will remain in effect (meaning this test can be used) for the duration of the COVID-19 declaration under Section 564(b)(1) of the Act, 21 U.S.C. section 360bbb-3(b)(1), unless the authorization is terminated or revoked sooner. Performed at [facility], [address] [place], [place] [address]   Blood Culture (routine x 2)     Status: None (Preliminary result)   Collection Time: 10/14/18  7:50 PM   Specimen: BLOOD  Result Value Ref Range Status   Specimen Description BLOOD RIGHT NECK  Final   Special Requests   Final    BOTTLES DRAWN AEROBIC AND ANAEROBIC Blood Culture results may not be optimal due to an inadequate volume of blood received in culture bottles   Culture   Final    NO GROWTH 2 DAYS Performed at [facility], [address] [place], [place] [address]    Report Status PENDING  Incomplete  Urine culture     Status: Abnormal (Preliminary result)   Collection Time: 10/14/18  9:05 PM   Specimen: In/Out Cath Urine  Result Value Ref Range Status   Specimen Description IN/OUT CATH URINE  Final   Special Requests   Final    NONE Performed at [facility], [address] [place], [place] [address]    Culture >=100,000 COLONIES/mL PROTEUS MIRABILIS (A)  Final   Report Status PENDING  Incomplete         Radiology Studies: Mr Angio Head Wo Contrast  Result Date: 10/15/2018 CLINICAL DATA:  Encephalopathy EXAM: MRI HEAD WITHOUT CONTRAST MRA HEAD WITHOUT CONTRAST TECHNIQUE: Multiplanar, multiecho pulse sequences of the brain and surrounding structures were obtained without intravenous contrast. Angiographic images of the head were obtained using MRA technique without contrast. COMPARISON:  Head CT 10/14/2018 FINDINGS: MRI HEAD FINDINGS BRAIN: There is no acute infarct, acute  hemorrhage or extra-axial collection. Early confluent hyperintense T2-weighted signal of the periventricular and deep white matter, most commonly due to chronic ischemic microangiopathy. Generalized advanced atrophy. No hydrocephalus. The midline structures are normal. There are multiple old small vessel infarcts. VASCULAR: The major intracranial arterial and venous sinus flow voids are  normal. Susceptibility weighted imaging shows multiple old microhemorrhages as well as sequelae of prior hemorrhagic events in the deep gray nuclei. SKULL AND UPPER CERVICAL SPINE: Calvarial bone marrow signal is normal. There is no skull base mass. The visualized upper cervical spine and soft tissues are normal. SINUSES/ORBITS: There are no fluid levels or advanced mucosal thickening. The mastoid air cells and middle ear cavities are free of fluid. The orbits are normal. MRA HEAD FINDINGS POSTERIOR CIRCULATION: --Vertebral arteries: Normal V4 segments. --Posterior inferior cerebellar arteries (PICA): Patent origins from the vertebral arteries. --Anterior inferior cerebellar arteries (AICA): Patent origins from the basilar artery. --Basilar artery: Normal. --Superior cerebellar arteries: Normal. --Posterior cerebral arteries: Normal. The left PCA is partially supplied by a posterior communicating artery (p-comm). ANTERIOR CIRCULATION: --Intracranial internal carotid arteries: Normal. --Anterior cerebral arteries (ACA): Normal. Both A1 segments are present. Patent anterior communicating artery (a-comm). --Middle cerebral arteries (MCA): Normal. IMPRESSION: 1. No acute intracranial abnormality. 2. Generalized advanced atrophy and chronic ischemic microangiopathy. 3. Multiple old infarcts and chronic microhemorrhages. 4. Normal MRA of the Circle of Willis. Electronically Signed   By: Deatra Robinson M.D.   On: 10/15/2018 01:20   Mr Brain Wo Contrast  Result Date: 10/15/2018 CLINICAL DATA:  Encephalopathy EXAM: MRI HEAD WITHOUT  CONTRAST MRA HEAD WITHOUT CONTRAST TECHNIQUE: Multiplanar, multiecho pulse sequences of the brain and surrounding structures were obtained without intravenous contrast. Angiographic images of the head were obtained using MRA technique without contrast. COMPARISON:  Head CT 10/14/2018 FINDINGS: MRI HEAD FINDINGS BRAIN: There is no acute infarct, acute hemorrhage or extra-axial collection. Early confluent hyperintense T2-weighted signal of the periventricular and deep white matter, most commonly due to chronic ischemic microangiopathy. Generalized advanced atrophy. No hydrocephalus. The midline structures are normal. There are multiple old small vessel infarcts. VASCULAR: The major intracranial arterial and venous sinus flow voids are normal. Susceptibility weighted imaging shows multiple old microhemorrhages as well as sequelae of prior hemorrhagic events in the deep gray nuclei. SKULL AND UPPER CERVICAL SPINE: Calvarial bone marrow signal is normal. There is no skull base mass. The visualized upper cervical spine and soft tissues are normal. SINUSES/ORBITS: There are no fluid levels or advanced mucosal thickening. The mastoid air cells and middle ear cavities are free of fluid. The orbits are normal. MRA HEAD FINDINGS POSTERIOR CIRCULATION: --Vertebral arteries: Normal V4 segments. --Posterior inferior cerebellar arteries (PICA): Patent origins from the vertebral arteries. --Anterior inferior cerebellar arteries (AICA): Patent origins from the basilar artery. --Basilar artery: Normal. --Superior cerebellar arteries: Normal. --Posterior cerebral arteries: Normal. The left PCA is partially supplied by a posterior communicating artery (p-comm). ANTERIOR CIRCULATION: --Intracranial internal carotid arteries: Normal. --Anterior cerebral arteries (ACA): Normal. Both A1 segments are present. Patent anterior communicating artery (a-comm). --Middle cerebral arteries (MCA): Normal. IMPRESSION: 1. No acute intracranial  abnormality. 2. Generalized advanced atrophy and chronic ischemic microangiopathy. 3. Multiple old infarcts and chronic microhemorrhages. 4. Normal MRA of the Circle of Willis. Electronically Signed   By: Deatra Robinson M.D.   On: 10/15/2018 01:20   Dg Chest Port 1 View  Result Date: 10/14/2018 CLINICAL DATA:  84 year old male with history of weakness. Left-sided neglect and left facial droop. EXAM: PORTABLE CHEST 1 VIEW COMPARISON:  Chest x-ray 07/24/2018. FINDINGS: Lung volumes are low. No consolidative airspace disease. No pleural effusions. No pneumothorax. No pulmonary nodule or mass noted. Pulmonary vasculature and the cardiomediastinal silhouette are within normal limits. IMPRESSION: 1. Low lung volumes without radiographic evidence acute cardiopulmonary disease. Electronically Signed   By:  Vinnie Langton M.D.   On: 10/14/2018 20:44   Ct Head Code Stroke Wo Contrast  Result Date: 10/14/2018 CLINICAL DATA:  Code stroke. Focal neuro deficit, less than 6 hours, stroke suspected. Additional history provided: Left-sided neglect, left facial droop. EXAM: CT HEAD WITHOUT CONTRAST TECHNIQUE: Contiguous axial images were obtained from the base of the skull through the vertex without intravenous contrast. COMPARISON:  No pertinent prior studies available for comparison. FINDINGS: Brain: No evidence of acute intracranial hemorrhage. No acute demarcated cortical infarction identified. Ill-defined hypoattenuation of the cerebral white matter is nonspecific, but consistent with chronic small vessel ischemic disease. Chronic appearing lacunar infarcts within the left corona radiata/internal capsule and within the left thalamus. Age-indeterminate lacunar infarct within the left caudate nucleus. No evidence of intracranial mass. No midline shift or extra-axial fluid collection. Moderate generalized parenchymal atrophy. Vascular: No hyperdense vessel. Skull: No calvarial fracture Sinuses/Orbits: Visualized orbits  demonstrate no acute abnormality. Mucosal thickening within the partially imaged left maxillary sinus. Trace left mastoid effusion. ASPECTS [facility] Stroke Program Early CT Score) - Ganglionic level infarction (caudate, lentiform nuclei, internal capsule, insula, M1-M3 cortex): 7 - Supraganglionic infarction (M4-M6 cortex): 3 Total score (0-10 with 10 being normal): 10 (in the right MCA vascular territory). These results were called by telephone at the time of interpretation on 10/14/2018 at 5:57 pm to provider Dr. Erlinda Hong, who verbally acknowledged these results. IMPRESSION: No evidence of intracranial hemorrhage or acute demarcated cortical infarction. Age-indeterminate lacunar infarct within the left caudate. Chronic lacunar infarcts within the left corona radiata/internal capsule and left thalamus. Generalized parenchymal atrophy with chronic small vessel ischemic disease. Electronically Signed   By: Kellie Simmering   On: 10/14/2018 18:07      Scheduled Meds:  allopurinol  300 mg Oral q morning - 10a   amLODipine  2.5 mg Oral q morning - 10a   hydrALAZINE  50 mg Oral BID   insulin aspart  0-9 Units Subcutaneous Q4H   losartan  25 mg Oral q morning - 10a   magnesium oxide  400 mg Oral Daily   metoprolol succinate  25 mg Oral q morning - 10a   pantoprazole  40 mg Oral Daily   pravastatin  10 mg Oral QHS   Continuous Infusions:  cefTRIAXone (ROCEPHIN)  IV Stopped (10/16/18 0550)   dextrose 5 % and 0.9% NaCl 100 mL/hr at 10/16/18 0959     LOS: 1 day      Debbe Odea, MD [facility] Pager: [email or web address] Password [email or web address] 10/16/2018, 4:07 PM

## 2018-10-16 NOTE — Progress Notes (Signed)
Pt admitted to the unit from ED; pt alert and verbally responsive; pt oriented to the unit and room; fall/safety precaution and prevention education completed with pt and pt voices understanding; skin dry and flaky; sacrum has old healed stage 2 pressure ulcers; barrier cream and foam dsg applied; VSS; telemetry applied and verified with CCMD: NT called to second verify. Bed alarm on; condom cath applied; IV started and transfusing; pt resting in bed with call light within reach. Will closely monitor. Delia Heady RN   10/16/18 1539  Vitals  Temp 97.7 F (36.5 C)  Temp Source Oral  BP (!) 198/92  MAP (mmHg) 122  BP Location Left Arm  BP Method Automatic  Patient Position (if appropriate) Lying  Pulse Rate 75  Resp 20  Oxygen Therapy  SpO2 99 %  O2 Device Room Air  Pain Assessment  Pain Scale 0-10  Pain Score 0  MEWS Score  MEWS RR 0  MEWS Pulse 0  MEWS Systolic 0  MEWS LOC 0  MEWS Temp 0  MEWS Score 0  MEWS Score Color Nyoka Cowden

## 2018-10-16 NOTE — ED Notes (Signed)
Cleaned pt's perineal area and applied clean brief. 

## 2018-10-17 ENCOUNTER — Inpatient Hospital Stay (HOSPITAL_COMMUNITY): Payer: Medicare Other

## 2018-10-17 DIAGNOSIS — Z66 Do not resuscitate: Secondary | ICD-10-CM

## 2018-10-17 DIAGNOSIS — R1310 Dysphagia, unspecified: Secondary | ICD-10-CM

## 2018-10-17 DIAGNOSIS — Z515 Encounter for palliative care: Secondary | ICD-10-CM

## 2018-10-17 LAB — GLUCOSE, CAPILLARY
Glucose-Capillary: 118 mg/dL — ABNORMAL HIGH (ref 70–99)
Glucose-Capillary: 135 mg/dL — ABNORMAL HIGH (ref 70–99)
Glucose-Capillary: 53 mg/dL — ABNORMAL LOW (ref 70–99)
Glucose-Capillary: 95 mg/dL (ref 70–99)

## 2018-10-17 LAB — URINE CULTURE: Culture: >=100000 — AB

## 2018-10-17 MED ORDER — DEXTROSE 50 % IV SOLN
INTRAVENOUS | Status: AC
  Administered 2018-10-17: 13:00:00 50 mL
  Filled 2018-10-17: qty 50

## 2018-10-17 MED ORDER — MORPHINE SULFATE (CONCENTRATE) 10 MG/0.5ML PO SOLN: 5.0000 mg | ORAL | Status: DC | PRN

## 2018-10-17 MED ORDER — INSULIN ASPART 100 UNIT/ML ~~LOC~~ SOLN: 0.0000 [IU] | Freq: Three times a day (TID) | SUBCUTANEOUS | Status: DC

## 2018-10-17 MED ORDER — LORAZEPAM 1 MG PO TABS: 1.0000 mg | ORAL_TABLET | ORAL | Status: DC | PRN

## 2018-10-17 MED ORDER — SODIUM CHLORIDE 0.9 % IV SOLN
INTRAVENOUS | Status: DC | PRN
  Administered 2018-10-17: 03:00:00 via INTRAVENOUS

## 2018-10-17 NOTE — Progress Notes (Addendum)
PROGRESS NOTE    Mason Webb.   ZOX:[id number]  DOB: October 10, 1934  DOA: 10/14/2018 PCP: Zoila Shutter, MD   Brief Narrative:  Mason Webb.  is a 84 y.o. male, w hypertension, hyperlipidemia, ? Dm2, Cad , apparently presents with altered mental status , confusion, and ? Left facial droop and left upper extremity neglect.  Noted to have a fever of 100.3 in ED and a + UA. Started on Ceftriaxone and admitted to r/o CVA  Subjective: He has no complaints today. No complaints of being hungry.  Assessment & Plan:   Principal Problem:  Acute encephalopathy superimposed on chronic dementia - MRI neg for CVA- symptoms related to UTI-  - Neuro did not feel he had a TIA-  - he did choke on meds and was NPO while awaiting an SLP eval - see below - PT recommends SNF- SW consulted for SNF search-   Dysphagia  - 10/6- SLP has evaluated today and notes he is a very high aspiration risk and is recommending NPO and palliative consult- he had a PEG tube in 2017 -- I tried calling 2 of his daughters to discuss his dysphagia and discuss hospice but did not get an answer - I have requested a palliative care eval - cont IVF    Acute lower UTI - causing above acute on chronic confusion - cont Ceftriaxone x 7 days-  culture growing proteus mirabilis sensitive to Ceftriaxone - per daughter he is incontinent and this may be why he has UTIs- will ask for a bladder scan to determine if he has overflow incontinence - mental status has improved from the first day I saw him - blood cultures negative    Hypertension - cont Antihypertensives- HOLD LASIX    Hyperlipidemia - cont Statin  Gout - cont Allopurinol  Addendum: I have spoken with his daughter, Era Bumpers who states it became infected and later came out and was never replaced. They have allowed him to eat despite his issues with aspiration after discussion with the PCP. They have been trying to "get him to a facility". The plan is  to stop all fluid and antibiotics, start a mechanical soft diet and see how he is eating- Palliative care will follow   Time spent in minutes: 35 DVT prophylaxis: SCDs Code Status: Full code Family Communication:  Disposition Plan: f/u SNF search and palliative care eval Consultants:   neuro Procedures:  MBS  Antimicrobials:  Anti-infectives (From admission, onward)   Start     Dose/Rate Route Frequency Ordered Stop   10/15/18 0300  cefTRIAXone (ROCEPHIN) 1 g in sodium chloride 0.9 % 100 mL IVPB     1 g 200 mL/hr over 30 Minutes Intravenous Every 24 hours 10/15/18 0207     10/14/18 2000  vancomycin (VANCOCIN) 1,500 mg in sodium chloride 0.9 % 500 mL IVPB     1,500 mg 250 mL/hr over 120 Minutes Intravenous  Once 10/14/18 1913 10/15/18 0052   10/14/18 1900  ceFEPIme (MAXIPIME) 2 g in sodium chloride 0.9 % 100 mL IVPB  Status:  Discontinued     2 g 200 mL/hr over 30 Minutes Intravenous  Once 10/14/18 1858 10/15/18 0220   10/14/18 1900  metroNIDAZOLE (FLAGYL) IVPB 500 mg     500 mg 100 mL/hr over 60 Minutes Intravenous  Once 10/14/18 1858 10/14/18 2213   10/14/18 1900  vancomycin (VANCOCIN) IVPB 1000 mg/200 mL premix  Status:  Discontinued     1,000 mg 200 mL/hr  over 60 Minutes Intravenous  Once 10/14/18 1858 10/14/18 1913       Objective: Vitals:   10/16/18 2326 10/17/18 0349 10/17/18 0816 10/17/18 1231  BP: (!) 167/101 (!) 161/93 (!) 171/91 116/73  Pulse: 76 91 76 65  Resp: 19 18 15 15   Temp: 98 F (36.7 C) 98.3 F (36.8 C) 97.7 F (36.5 C) 98.3 F (36.8 C)  TempSrc: Oral Axillary Oral Oral  SpO2: 100% 97% 100% 100%    Intake/Output Summary (Last 24 hours) at 10/17/2018 1315 Last data filed at 10/17/2018 0400 Gross per 24 hour  Intake 2348.19 ml  Output 250 ml  Net 2098.19 ml   There were no vitals filed for this visit.  Examination: General exam: Appears comfortable  HEENT: PERRLA, oral mucosa moist, no sclera icterus or thrush Respiratory system: Clear to  auscultation. Respiratory effort normal. Cardiovascular system: S1 & S2 heard,  No murmurs  Gastrointestinal system: Abdomen soft, non-tender, nondistended. Normal bowel sounds   Central nervous system: Alert and oriented only to person. No focal neurological deficits. Extremities: No cyanosis, clubbing or edema Skin: No rashes or ulcers Psychiatry:  Mood & affect appropriate.   Data Reviewed: I have personally reviewed following labs and imaging studies  CBC: Recent Labs  Lab 10/14/18 1737 10/14/18 1739 10/15/18 0243  WBC 9.0  --  10.3  NEUTROABS 7.2  --   --   HGB 10.8* 12.2* 10.0*  HCT 32.6* 36.0* 31.2*  MCV 93.1  --  93.4  PLT 320  --  276   Basic Metabolic Panel: Recent Labs  Lab 10/14/18 1737 10/14/18 1739 10/15/18 0243  NA 143 142 142  K 4.1 4.0 3.9  CL 105 105 104  CO2 27  --  27  GLUCOSE 131* 122* 114*  BUN 15 20 14   CREATININE 0.96 0.80 0.83  CALCIUM 9.2  --  8.8*   GFR: CrCl cannot be calculated (Unknown ideal weight.). Liver Function Tests: Recent Labs  Lab 10/14/18 1737 10/15/18 0243  AST 18 14*  ALT 12 7  ALKPHOS 50 51  BILITOT 0.7 0.4  PROT 7.3 6.5  ALBUMIN 3.2* 2.6*   No results for input(s): LIPASE, AMYLASE in the last 168 hours. Recent Labs  Lab 10/14/18 1848  AMMONIA 53*   Coagulation Profile: Recent Labs  Lab 10/14/18 1737  INR 1.0   Cardiac Enzymes: No results for input(s): CKTOTAL, CKMB, CKMBINDEX, TROPONINI in the last 168 hours. BNP (last 3 results) No results for input(s): PROBNP in the last 8760 hours. HbA1C: Recent Labs    10/15/18 0242  HGBA1C 5.5   CBG: Recent Labs  Lab 10/16/18 1555 10/16/18 1959 10/17/18 0018 10/17/18 0501 10/17/18 1227  GLUCAP 114* 111* 118* 135* 53*   Lipid Profile: Recent Labs    10/15/18 0242  CHOL 138  HDL 47  LDLCALC 79  TRIG 61  CHOLHDL 2.9   Thyroid Function Tests: Recent Labs    10/14/18 1958  TSH 2.618   Anemia Panel: Recent Labs    10/14/18 1958    VITAMINB12 530   Urine analysis:    Component Value Date/Time   COLORURINE YELLOW 10/14/2018 2105   APPEARANCEUR CLOUDY (A) 10/14/2018 2105   LABSPEC 1.016 10/14/2018 2105   PHURINE 8.0 10/14/2018 2105   GLUCOSEU NEGATIVE 10/14/2018 2105   HGBUR NEGATIVE 10/14/2018 2105   BILIRUBINUR NEGATIVE 10/14/2018 2105   KETONESUR NEGATIVE 10/14/2018 2105   PROTEINUR 30 (A) 10/14/2018 2105   NITRITE POSITIVE (A) 10/14/2018 2105  LEUKOCYTESUR LARGE (A) 10/14/2018 2105   Sepsis Labs: @LABRCNTIP (procalcitonin:4,lacticidven:4) ) Recent Results (from the past 240 hour(s))  SARS Coronavirus 2 [facility] order, Performed in [facility] hospital lab) Nasopharyngeal Nasopharyngeal Swab     Status: None   Collection Time: 10/14/18  6:55 PM   Specimen: Nasopharyngeal Swab  Result Value Ref Range Status   SARS Coronavirus 2 NEGATIVE NEGATIVE Final    Comment: (NOTE) If result is NEGATIVE SARS-CoV-2 target nucleic acids are NOT DETECTED. The SARS-CoV-2 RNA is generally detectable in upper and lower  respiratory specimens during the acute phase of infection. The lowest  concentration of SARS-CoV-2 viral copies this assay can detect is 250  copies / mL. A negative result does not preclude SARS-CoV-2 infection  and should not be used as the sole basis for treatment or other  patient management decisions.  A negative result may occur with  improper specimen collection / handling, submission of specimen other  than nasopharyngeal swab, presence of viral mutation(s) within the  areas targeted by this assay, and inadequate number of viral copies  (<250 copies / mL). A negative result must be combined with clinical  observations, patient history, and epidemiological information. If result is POSITIVE SARS-CoV-2 target nucleic acids are DETECTED. The SARS-CoV-2 RNA is generally detectable in upper and lower  respiratory specimens dur ing the acute phase of infection.  Positive  results are indicative  of active infection with SARS-CoV-2.  Clinical  correlation with patient history and other diagnostic information is  necessary to determine patient infection status.  Positive results do  not rule out bacterial infection or co-infection with other viruses. If result is PRESUMPTIVE POSTIVE SARS-CoV-2 nucleic acids MAY BE PRESENT.   A presumptive positive result was obtained on the submitted specimen  and confirmed on repeat testing.  While 2019 novel coronavirus  (SARS-CoV-2) nucleic acids may be present in the submitted sample  additional confirmatory testing may be necessary for epidemiological  and / or clinical management purposes  to differentiate between  SARS-CoV-2 and other Sarbecovirus currently known to infect humans.  If clinically indicated additional testing with an alternate test  methodology [phone]) is advised. The SARS-CoV-2 RNA is generally  detectable in upper and lower respiratory sp ecimens during the acute  phase of infection. The expected result is Negative. Fact Sheet for Patients:  (OJJ0093 Fact Sheet for Healthcare Providers: BoilerBrush.com.cy This test is not yet approved or cleared by the https://pope.com/ FDA and has been authorized for detection and/or diagnosis of SARS-CoV-2 by FDA under an Emergency Use Authorization (EUA).  This EUA will remain in effect (meaning this test can be used) for the duration of the COVID-19 declaration under Section 564(b)(1) of the Act, 21 U.S.C. section 360bbb-3(b)(1), unless the authorization is terminated or revoked sooner. Performed at [facility], [address], [place], [place]   Blood Culture (routine x 2)     Status: None (Preliminary result)   Collection Time: 10/14/18  7:50 PM   Specimen: BLOOD  Result Value Ref Range Status   Specimen Description BLOOD RIGHT NECK  Final   Special Requests   Final    BOTTLES DRAWN AEROBIC AND ANAEROBIC  Blood Culture results may not be optimal due to an inadequate volume of blood received in culture bottles   Culture   Final    NO GROWTH 3 DAYS Performed at [facility], [address] [place], [place]    Report Status PENDING  Incomplete  Urine culture     Status: Abnormal   Collection Time: 10/14/18  9:05 PM   Specimen: In/Out Cath Urine  Result Value Ref Range Status   Specimen Description IN/OUT CATH URINE  Final   Special Requests   Final    NONE Performed at [facility], [address] [place], [place] [address]    Culture >=100,000 COLONIES/mL PROTEUS MIRABILIS (A)  Final   Report Status 10/17/2018 FINAL  Final   Organism ID, Bacteria PROTEUS MIRABILIS (A)  Final      Susceptibility   Proteus mirabilis - MIC*    AMPICILLIN <=2 SENSITIVE Sensitive     CEFAZOLIN <=4 SENSITIVE Sensitive     CEFTRIAXONE <=1 SENSITIVE Sensitive     CIPROFLOXACIN <=0.25 SENSITIVE Sensitive     GENTAMICIN <=1 SENSITIVE Sensitive     IMIPENEM 2 SENSITIVE Sensitive     NITROFURANTOIN 256 RESISTANT Resistant     TRIMETH/SULFA <=20 SENSITIVE Sensitive     AMPICILLIN/SULBACTAM <=2 SENSITIVE Sensitive     PIP/TAZO <=4 SENSITIVE Sensitive     * >=100,000 COLONIES/mL PROTEUS MIRABILIS         Radiology Studies: Dg Swallowing Func-speech Pathology  Result Date: 10/17/2018 Objective Swallowing Evaluation: Type of Study: MBS-Modified Barium Swallow Study  Patient Details Name: Mason Webb. 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  Pt cxr showed low lung volumes.  Pt reportedly resides with his wife.  He choked on pills. Per chart review, pt has h/o PEG tube placement- ? indication?  Subjective: pt awake in chair Assessment / Plan / Recommendation CHL IP CLINICAL IMPRESSIONS 10/17/2018 Clinical Impression The patient presents with moderate oral dysphagia characterized by delayed oral transit, lingual pumping and premature spill.  Severe Pharyngeal dysphagia with decreased epiglottic deflection, decreased hyo-laryngeal excursion, little to no pharyngeal constriction resulting in gross vallecula and moderate pyriform residue (that mix with secretions). The patient was observed to swallow multiple times inconsistently in attempt to clear the pharyngeal residue and generally was not successful. Head turn right did not decrease accumulation in pharynx. Cued "Hock" was not adequate and thus no residual was removed.  Cricopharyngeal opening appeared to be inadequate.  Pt with gross retention (mixed with secretions) in phayrnx without consistent sensation nor ability to clear.  Laryngeal penetration noted with all liquids with pt demonstrating reflexive throat clear but this did not successfully clear material from the laryngeal vestibule.  Given h/o dysphagia requiring PEG placement, suspect this is an exacerbation of baseline difficulties. Given pt's advanced age, dementia and acute on chronic dysphagia (dating back to at least 2016) - recommend to consider a palliative consult to establish goals of care.  Pt admits he is losing weight and has been coughing with intake prior to admit.  Rec npo except tsps water and ice chips at this time. Marland Kitchen SLP Visit Diagnosis Dysphagia, oropharyngeal phase (R13.12);Dysphagia, pharyngoesophageal phase (R13.14) Attention and concentration deficit following -- Frontal lobe and executive function deficit following -- Impact on safety and function Severe  aspiration risk;Risk  for inadequate nutrition/hydration   CHL IP TREATMENT RECOMMENDATION 10/17/2018 Treatment Recommendations Therapy as outlined in treatment plan below   Prognosis 10/17/2018 Prognosis for Safe Diet Advancement Guarded Barriers to Reach Goals Severity of deficits;Other (Comment) Barriers/Prognosis Comment premorbid deficits dating back to at least 2016 - pt underwent mbs 2017 with recommendation for npo CHL IP DIET RECOMMENDATION 10/17/2018 SLP Diet Recommendations NPO;Ice chips PRN after oral care Liquid Administration via Spoon Medication Administration Via alternative means Compensations Multiple dry swallows after each bite/sip Postural Changes --   No flowsheet data found.  CHL IP FOLLOW UP RECOMMENDATIONS 10/17/2018 Follow up Recommendations (No Data)   CHL IP FREQUENCY AND DURATION 10/17/2018 Speech Therapy Frequency (ACUTE ONLY) min 2x/week Treatment Duration 1 week      CHL IP ORAL PHASE 10/17/2018 Oral Phase Impaired Oral - Pudding Teaspoon -- Oral - Pudding Cup -- Oral - Honey Teaspoon -- Oral - Honey Cup -- Oral - Nectar Teaspoon Delayed oral transit;Weak lingual manipulation Oral - Nectar Cup Delayed oral transit;Weak lingual manipulation Oral - Nectar Straw Weak lingual manipulation;Delayed oral transit Oral - Thin Teaspoon Weak lingual manipulation;Delayed oral transit Oral - Thin Cup Weak lingual manipulation;Delayed oral transit Oral - Thin Straw Weak lingual manipulation;Delayed oral transit Oral - Puree Weak lingual manipulation;Delayed oral transit;Lingual pumping Oral - Mech Soft NT Oral - Regular -- Oral - Multi-Consistency -- Oral - Pill NT Oral Phase - Comment --  CHL IP PHARYNGEAL PHASE 10/17/2018 Pharyngeal Phase Impaired Pharyngeal- Pudding Teaspoon -- Pharyngeal -- Pharyngeal- Pudding Cup -- Pharyngeal -- Pharyngeal- Honey Teaspoon -- Pharyngeal -- Pharyngeal- Honey Cup -- Pharyngeal -- Pharyngeal- Nectar Teaspoon Reduced laryngeal elevation;Reduced anterior laryngeal mobility;Reduced  airway/laryngeal closure;Reduced epiglottic inversion;Pharyngeal residue - valleculae;Pharyngeal residue - pyriform;Reduced pharyngeal peristalsis Pharyngeal Material does not enter airway Pharyngeal- Nectar Cup Reduced epiglottic inversion;Reduced anterior laryngeal mobility;Reduced laryngeal elevation;Reduced airway/laryngeal closure;Reduced tongue base retraction;Pharyngeal residue - valleculae;Pharyngeal residue - pyriform;Reduced pharyngeal peristalsis Pharyngeal Material does not enter airway Pharyngeal- Nectar Straw Reduced laryngeal elevation;Reduced anterior laryngeal mobility;Reduced epiglottic inversion;Reduced pharyngeal peristalsis;Reduced airway/laryngeal closure;Pharyngeal residue - valleculae;Pharyngeal residue - pyriform;Reduced tongue base retraction Pharyngeal Material does not enter airway Pharyngeal- Thin Teaspoon Reduced epiglottic inversion;Reduced anterior laryngeal mobility;Reduced laryngeal elevation;Reduced airway/laryngeal closure;Reduced tongue base retraction;Penetration/Aspiration during swallow;Pharyngeal residue - valleculae;Pharyngeal residue - pyriform;Reduced pharyngeal peristalsis Pharyngeal Material enters airway, remains ABOVE vocal cords and not ejected out Pharyngeal- Thin Cup Reduced pharyngeal peristalsis;Reduced epiglottic inversion;Reduced anterior laryngeal mobility;Reduced airway/laryngeal closure;Reduced tongue base retraction;Reduced laryngeal elevation;Pharyngeal residue - valleculae;Pharyngeal residue - pyriform;Penetration/Aspiration during swallow;Penetration/Apiration after swallow Pharyngeal Material enters airway, CONTACTS cords and not ejected out Pharyngeal- Thin Straw Reduced epiglottic inversion;Reduced anterior laryngeal mobility;Reduced laryngeal elevation;Reduced airway/laryngeal closure;Reduced tongue base retraction;Pharyngeal residue - valleculae;Pharyngeal residue - pyriform Pharyngeal Material enters airway, passes BELOW cords and not ejected out  despite cough attempt by patient Pharyngeal- Puree Reduced epiglottic inversion;Reduced anterior laryngeal mobility;Reduced laryngeal elevation;Reduced airway/laryngeal closure;Reduced tongue base retraction;Pharyngeal residue - valleculae;Reduced pharyngeal peristalsis Pharyngeal Material does not enter airway Pharyngeal- Mechanical Soft NT Pharyngeal -- Pharyngeal- Regular -- Pharyngeal -- Pharyngeal- Multi-consistency -- Pharyngeal -- Pharyngeal- Pill NT Pharyngeal -- Pharyngeal Comment --  CHL IP CERVICAL ESOPHAGEAL PHASE 10/17/2018 Cervical Esophageal Phase Impaired Pudding Teaspoon -- Pudding Cup -- Honey Teaspoon -- Honey Cup -- Nectar Teaspoon Reduced cricopharyngeal relaxation Nectar Cup Reduced cricopharyngeal relaxation Nectar Straw Reduced cricopharyngeal relaxation Thin Teaspoon Reduced cricopharyngeal relaxation Thin Cup Reduced cricopharyngeal relaxation Thin Straw Reduced cricopharyngeal relaxation Puree Reduced cricopharyngeal relaxation Mechanical Soft -- Regular -- Multi-consistency -- Pill -- Cervical  Esophageal Comment -- Chales Abrahams 10/17/2018, 9:24 AM Donavan Burnet, MS [facility] SLP Acute Rehab Services Pager [phone] Office [phone]                 Scheduled Meds:  allopurinol  300 mg Oral q morning - 10a   amLODipine  2.5 mg Oral q morning - 10a   hydrALAZINE  50 mg Oral BID   insulin aspart  0-9 Units Subcutaneous Q4H   losartan  25 mg Oral q morning - 10a   magnesium oxide  400 mg Oral Daily   metoprolol succinate  25 mg Oral q morning - 10a   pantoprazole  40 mg Oral Daily   pravastatin  10 mg Oral QHS   Continuous Infusions:  sodium chloride 10 mL/hr at 10/17/18 0400   cefTRIAXone (ROCEPHIN)  IV Stopped (10/17/18 0313)   dextrose 5 % and 0.9% NaCl 100 mL/hr at 10/17/18 0400     LOS: 2 days      Calvert Cantor, MD [facility] Pager: [email or web address] Password [email or web address] 10/17/2018, 1:15 PM

## 2018-10-17 NOTE — Consult Note (Signed)
Consultation Note Date: 10/17/2018   Patient Name: Mason Webb.  DOB: [birth year]  MRN: [id number]  Age / Sex: 84 y.o., male  PCP: Burman Freestone, MD Referring Physician: Debbe Odea, MD  Reason for Consultation: Establish GOCs and emotional support  HPI/Patient Profile: 84 y.o. male  admitted on 10/14/2018 with   PMH of stroke with unknown residue deficit presented for acute onset confusion and left facial droop as per family. and concern for new onset stroke.  Brain MRI 10-15-18 IMPRESSION: 1. No acute intracranial abnormality. 2. Generalized advanced atrophy and chronic ischemic microangiopathy. 3. Multiple old infarcts and chronic microhemorrhages. 4. Normal MRA of the Circle of Willis.  Family report continued physical,functional and cognitive decline over the past year.  Family have been considering placement for both this patient and his wife who too has significant cognitive deficits.   Patient has ongoing issues with dysphagia over the past three years and at one point had a PEG.  MBS today significant for severe dysphagia  He lives at home with wife who too has significant cognitive deficits.  Family report caregivers in the home for both.  They are considering placement for both.   Family face treatment option decisions, advanced directive decisions and anticipatory care needs.  Clinical Assessment and Goals of Care:  This NP Wadie Lessen reviewed medical records, received report from team, and then spoke to daughters [name] Hines/ reported  HPOA and Counselling psychologist by phone  to discuss diagnosis, prognosis, GOC, EOL wishes disposition and options.  Concept of Hospice and Palliative Care were discussed  A detailed discussion was had today regarding advanced directives.  Concepts specific to code status, artifical feeding and hydration, continued IV antibiotics and  rehospitalization was had.  The difference between a aggressive medical intervention path  and a palliative comfort care path for this patient at this time was had.  Values and goals of care important to patient and family were attempted to be elicited.  Both daughters verbalize understanding of the seriousness of the current medical situation and the likely limited long term prognosis.   Both verbalize their desire to focus on comfort and allow for a natural death.   Natural trajectory and expectations at EOL were discussed.  Questions and concerns addressed.   Family encouraged to call with questions or concerns.    PMT will continue to support holistically.      Both daughters tell me the primary decision maker is /HPOA/Sandra Derrel Nip /daughter   SUMMARY OF RECOMMENDATIONS    Code Status/Advance Care Planning:  DNR/DNI  No artificial feeding or hydration now or in the future  No further diagnostics, or life prolonging measures  Minimize medications  Symptom management to enhance comfort   Symptom Management:   Dyspnea/Pain: Roxanol 5 mg po/sl every 1 hr prn  Agitation: Ativan 1 mg po/sl every 4 hrs  Dysphagia: Diet as tolerated with known risk of aspiration  Additional Recommendations (Limitations, Scope, Preferences):  Comfort, quality and dignity are focus of care per family  Psycho-social/Spiritual:   Desire for further Chaplaincy support: yes  Created space and opportunity for daughters to explore their thoughts and feelings regarding current medical situation.  Emotional support offered  Prognosis:   Shift to comfort and re-evaluate tomorrow afternoon for more accurate prognosis and appropriate disposition.   Discharge Planning:  To be determined      Primary Diagnoses: Present on Admission: . Acute lower UTI . Altered mental status . Hypertension . Hyperlipidemia . Encephalopathy acute   I have reviewed the medical record, interviewed the  patient and family, and examined the patient. The following aspects are pertinent.  Past Medical History:  Diagnosis Date  . CAD (coronary artery disease)   . Hemorrhagic stroke (HCC)   . Hyperlipidemia   . Hypertension    Social History   Socioeconomic History  . Marital status: Single    Spouse name: Not on file  . Number of children: Not on file  . Years of education: Not on file  . Highest education level: Not on file  Occupational History  . Not on file  Social Needs  . Financial resource strain: Not on file  . Food insecurity    Worry: Not on file    Inability: Not on file  . Transportation needs    Medical: Not on file    Non-medical: Not on file  Tobacco Use  . Smoking status: Former Games developer  . Smokeless tobacco: Never Used  Substance and Sexual Activity  . Alcohol use: Not Currently  . Drug use: Not on file  . Sexual activity: Not on file  Lifestyle  . Physical activity    Days per week: Not on file    Minutes per session: Not on file  . Stress: Not on file  Relationships  . Social Musician on phone: Not on file    Gets together: Not on file    Attends religious service: Not on file    Active member of club or organization: Not on file    Attends meetings of clubs or organizations: Not on file    Relationship status: Not on file  Other Topics Concern  . Not on file  Social History Narrative  . Not on file   History reviewed. No pertinent family history. Scheduled Meds: . allopurinol  300 mg Oral q morning - 10a  . amLODipine  2.5 mg Oral q morning - 10a  . hydrALAZINE  50 mg Oral BID  . insulin aspart  0-9 Units Subcutaneous TID WC  . losartan  25 mg Oral q morning - 10a  . magnesium oxide  400 mg Oral Daily  . metoprolol succinate  25 mg Oral q morning - 10a  . pantoprazole  40 mg Oral Daily  . pravastatin  10 mg Oral QHS   Continuous Infusions: . sodium chloride 10 mL/hr at 10/17/18 0400  . cefTRIAXone (ROCEPHIN)  IV Stopped  (10/17/18 0313)  . dextrose 5 % and 0.9% NaCl 100 mL/hr at 10/17/18 0400   PRN Meds:.sodium chloride, acetaminophen **OR** acetaminophen (TYLENOL) oral liquid 160 mg/5 mL **OR** acetaminophen, hydrALAZINE, metoprolol tartrate, QUEtiapine Medications Prior to Admission:  Prior to Admission medications   Medication Sig Start Date End Date Taking? Authorizing Provider  allopurinol (ZYLOPRIM) 300 MG tablet Take 300 mg by mouth every morning. 07/20/18  Yes [provider]  amLODipine (NORVASC) 2.5 MG tablet Take 2.5 mg by mouth every morning. 08/21/18  Yes [provider]  aspirin EC 81 MG tablet  Take 81 mg by mouth every morning.   Yes [provider]  Cholecalciferol (VITAMIN D3 PO) Take 1 tablet by mouth every morning.   Yes [provider]  furosemide (LASIX) 20 MG tablet Take 20 mg by mouth every morning. 09/17/18  Yes [provider]  hydrALAZINE (APRESOLINE) 50 MG tablet Take 50 mg by mouth 2 (two) times daily. Hold for SBP <100 08/31/18  Yes [provider]  losartan (COZAAR) 25 MG tablet Take 25 mg by mouth every morning. 08/03/18  Yes [provider]  magnesium oxide (MAG-OX) 400 MG tablet Take 400 mg by mouth every morning. 07/20/18  Yes [provider]  metFORMIN (GLUCOPHAGE) 500 MG tablet Take 500 mg by mouth 2 (two) times daily with a meal. 08/31/18  Yes [provider]  metoprolol succinate (TOPROL-XL) 25 MG 24 hr tablet Take 25 mg by mouth every morning. 10/10/18  Yes [provider]  omeprazole (PRILOSEC) 40 MG capsule Take 40 mg by mouth every morning. 05/26/15  Yes [provider]  pravastatin (PRAVACHOL) 10 MG tablet Take 10 mg by mouth at bedtime.   Yes [provider]  QUEtiapine (SEROQUEL) 50 MG tablet Take 50 mg by mouth at bedtime as needed (anxiety).   Yes [provider]  vitamin B-12 (CYANOCOBALAMIN) 250 MCG tablet Take 250 mcg by mouth every morning.   Yes [provider]   No Known Allergies     Vital Signs: BP 116/73 (BP Location: Left Arm)   Pulse 65   Temp 98.3 F (36.8 C) (Oral)   Resp 15   SpO2 100%  Pain Scale: PAINAD   Pain Score: 0-No pain   SpO2: SpO2: 100 % O2 Device:SpO2: 100 % O2 Flow Rate: .   IO: Intake/output summary:   Intake/Output Summary (Last 24 hours) at 10/17/2018 1554 Last data filed at 10/17/2018 0400 Gross per 24 hour  Intake 2348.19 ml  Output 250 ml  Net 2098.19 ml    LBM: Last BM Date: 10/16/18 Baseline Weight:   Most recent weight:       Palliative Assessment/Data: 30 % at best   Discussed with Dr Butler Denmarkizwan and bedside RN  Time In: 1415 Time Out: 1530 Time Total: 75 minutes Greater than 50%  of this time was spent counseling and coordinating care related to the above assessment and plan.  Signed by: Lorinda CreedMary Larach, NP   Please contact Palliative Medicine Team phone at [phone] for questions and concerns.  For individual provider: See Loretha StaplerAmion

## 2018-10-17 NOTE — Progress Notes (Signed)
Occupational Therapy Evaluation Patient Details Name: Mason Webb. MRN: [id number] DOB: November 03, 1934 Today's Date: 10/17/2018    History of Present Illness 84 y.o. male with PMH of HTN, HLD and stroke with unknown residual  Deficits  presented for acute onset of confusion and left facial droop as per family; per neurology, non-focal exam, AMS present, but possible not stroke; likely UTI with encephalopathy   Clinical Impression   Pt reports living on ground floor apartment with wife and independence with ADLs; however, pt has dementia at baseline, and is poor historian regarding prior function and independence. Pt currently presents with decreased safety, awareness, problem solving, initiation, and sequencing. Pt performed functional mobility with min A and grooming tasks with mod A, requiring max VCs throughout. Pt would benefit from continued OT services acutely to address deficits and improve independence with ADLs. Recommend dc to SNF to address deficits with cognition and safety.    Follow Up Recommendations  SNF;Supervision/Assistance - 24 hour    Equipment Recommendations  Other (comment)(defer to next venue)    Recommendations for Other Services PT consult     Precautions / Restrictions Precautions Precautions: Fall Restrictions Weight Bearing Restrictions: No      Mobility Bed Mobility Overal bed mobility: Needs Assistance Bed Mobility: Rolling;Sidelying to Sit Rolling: Mod assist;+2 for physical assistance Sidelying to sit: Min assist;+2 for physical assistance Supine to sit: Mod assist;+2 for safety/equipment Sit to supine: Mod assist;+2 for safety/equipment   General bed mobility comments: Pt required max VCs and mod A to initiate rolling and min A to lift to sitting  Transfers Overall transfer level: Needs assistance Equipment used: Rolling walker (2 wheeled) Transfers: Sit to/from Stand Sit to Stand: Min assist;+2 safety/equipment         General  transfer comment: min A for safety with max VCs to initiate task    Balance Overall balance assessment: Needs assistance Sitting-balance support: Feet supported;No upper extremity supported Sitting balance-Leahy Scale: Fair     Standing balance support: No upper extremity supported;During functional activity Standing balance-Leahy Scale: Poor Standing balance comment: difficulty maintaining upright posture standing at sink                           ADL either performed or assessed with clinical judgement   ADL Overall ADL's : Needs assistance/impaired Eating/Feeding: NPO   Grooming: Oral care;Moderate assistance;Standing;Min guard;Cueing for safety;Cueing for sequencing Grooming Details (indicate cue type and reason): pt required max verbal cues and min guard A for standing for oral care. Pt presented with poor sequencing and required mod A throughout for gathering and opening tools for task.  Upper Body Bathing: Minimal assistance;Sitting;Cueing for safety;Cueing for sequencing   Lower Body Bathing: Moderate assistance;Sit to/from stand;Cueing for safety;Cueing for sequencing   Upper Body Dressing : Minimal assistance;Sitting;Cueing for safety;Cueing for sequencing   Lower Body Dressing: Moderate assistance;Sit to/from stand;Cueing for safety;Cueing for sequencing   Toilet Transfer: Min guard;Ambulation;BSC;RW   Toileting- Clothing Manipulation and Hygiene: Sit to/from stand;Cueing for safety;Cueing for sequencing;Minimal assistance       Functional mobility during ADLs: Min guard;Rolling walker General ADL Comments: pt required min A-mod A for most ADLs for safety, with max VCs for initiation and sequencing tasks     Vision         Perception     Praxis      Pertinent Vitals/Pain Pain Assessment: No/denies pain     Hand Dominance     Extremity/Trunk  Assessment Upper Extremity Assessment Upper Extremity Assessment: Generalized weakness   Lower  Extremity Assessment Lower Extremity Assessment: Defer to PT evaluation   Cervical / Trunk Assessment Cervical / Trunk Assessment: Kyphotic   Communication Communication Communication: Other (comment)(only responds to yes/no questions. pt has impaired cognition)   Cognition Arousal/Alertness: Awake/alert Behavior During Therapy: Flat affect Overall Cognitive Status: No family/caregiver present to determine baseline cognitive functioning Area of Impairment: Attention;Memory;Following commands;Safety/judgement;Awareness;Problem solving                 Orientation Level: Place;Time;Situation Current Attention Level: Sustained Memory: Decreased short-term memory Following Commands: Follows one step commands inconsistently;Follows one step commands with increased time Safety/Judgement: Decreased awareness of safety;Decreased awareness of deficits Awareness: Intellectual Problem Solving: Slow processing;Decreased initiation;Difficulty sequencing;Requires verbal cues;Requires tactile cues General Comments: Pt demonstrated difficulty with initation, requiring max verbal and tactile cues to initiate. Pt demonstrated difficulty following cues to engage in steps for oral care. Pt attempted to brush teeth with toothbrush still in plastic wrapper. Pt required assist to open toothbrush and toothpaste, and attempted to mimic therapist to open tools when given cues to complete next sequence of task.    General Comments  pt with dry skin at lower back, attempted multiple times to reach and sctratch at it. Lotion was applied    Exercises     Shoulder Instructions      Home Living Family/patient expects to be discharged to:: Unsure                                 Additional Comments: pt poor historian, unsure if information provided is accurate.      Prior Functioning/Environment Level of Independence: (unsure)        Comments: Pt unable to provide        OT Problem  List: Decreased activity tolerance;Impaired balance (sitting and/or standing);Decreased cognition;Decreased safety awareness;Decreased knowledge of use of DME or AE;Decreased knowledge of precautions      OT Treatment/Interventions: Self-care/ADL training;DME and/or AE instruction;Balance training    OT Goals(Current goals can be found in the care plan section) Acute Rehab OT Goals Patient Stated Goal: did not state OT Goal Formulation: Patient unable to participate in goal setting Time For Goal Achievement: 10/31/18 Potential to Achieve Goals: Fair  OT Frequency: Min 2X/week   Barriers to D/C:            Co-evaluation   Reason for Co-Treatment: Complexity of the patient's impairments (multi-system involvement);For patient/therapist safety PT goals addressed during session: Mobility/safety with mobility;Balance;Proper use of DME        AM-PAC OT "6 Clicks" Daily Activity     Outcome Measure Help from another person eating meals?: Total(NPO) Help from another person taking care of personal grooming?: A Lot Help from another person toileting, which includes using toliet, bedpan, or urinal?: A Little Help from another person bathing (including washing, rinsing, drying)?: A Lot Help from another person to put on and taking off regular upper body clothing?: A Little Help from another person to put on and taking off regular lower body clothing?: A Lot 6 Click Score: 13   End of Session Equipment Utilized During Treatment: Gait belt;Rolling walker Nurse Communication: Mobility status  Activity Tolerance: Patient tolerated treatment well Patient left: in chair;with call bell/phone within reach;with chair alarm set  OT Visit Diagnosis: Unsteadiness on feet (R26.81);Other abnormalities of gait and mobility (R26.89);Cognitive communication deficit (R41.841);Other symptoms and signs  involving cognitive function                Time: 1432-1459 OT Time Calculation (min): 27 min Charges:   OT General Charges $OT Visit: 1 Visit OT Evaluation $OT Eval Moderate Complexity: 1 Mod  Sandrea HammondJoAnna Chrystine Frogge, OT Student Sandrea HammondJoAnna Avin Gibbons 10/17/2018, 5:09 PM

## 2018-10-17 NOTE — Progress Notes (Signed)
Objective Swallowing Evaluation: Type of Study: MBS-Modified Barium Swallow Study   Patient Details  Name: Mason Webb. MRN: [id number] Date of Birth: 02-Dec-1934  Today's Date: 10/17/2018 Time: SLP Start Time (ACUTE ONLY): 6834 -SLP Stop Time (ACUTE ONLY): 0859  SLP Time Calculation (min) (ACUTE ONLY): 24 min   Past Medical History:  Past Medical History:  Diagnosis Date  . CAD (coronary artery disease)   . Hemorrhagic stroke (HCC)   . Hyperlipidemia   . Hypertension    Past Surgical History:  Past Surgical History:  Procedure Laterality Date  . PEG PLACEMENT     HPI: 84 yo male adm to [facility] with AMS - MRI negative except for old gangionic, left thalamic and caudate cva.  Pt cxr showed low lung volumes.  Pt reportedly resides with his wife.  He choked on pills. Per chart review, pt has h/o PEG tube placement- ? Indication?  Pt underwent MBS in 2017 ? At [facility] - with rec of NPO due to level of dysphagia.    Subjective: pt awake in chair    Assessment / Plan / Recommendation  CHL IP CLINICAL IMPRESSIONS 10/17/2018  Clinical Impression The patient presents with moderate oral dysphagia characterized by delayed oral transit, lingual pumping and premature spill.  Severe Pharyngeal dysphagia with decreased epiglottic deflection, decreased hyo-laryngeal excursion, little to no pharyngeal constriction resulting in gross vallecula and moderate pyriform residue (that mix with secretions).    The patient was observed to swallow multiple times inconsistently in attempt to clear the pharyngeal residue and generally was not successful. Head turn right did not decrease accumulation in pharynx. Cued "Hock" was not adequate and thus no residual was removed.  Cricopharyngeal opening appeared to be inadequate.  Pt with gross retention (mixed with secretions) in phayrnx without consistent sensation nor ability to clear.  Laryngeal penetration noted with all liquids with pt demonstrating  reflexive throat clear but this did not successfully clear material from the laryngeal vestibule.  Given h/o dysphagia requiring PEG placement, suspect this is an exacerbation of baseline difficulties. Given pt's advanced age, dementia and acute on chronic dysphagia (dating back to at least 2016) - recommend to consider a palliative consult to establish goals of care.  Pt admits he is losing weight and has been coughing with intake prior to admit.  Rec npo except tsps water and ice chips at this time. Marland Kitchen  SLP Visit Diagnosis Dysphagia, oropharyngeal phase (R13.12);Dysphagia, pharyngoesophageal phase (R13.14)  Attention and concentration deficit following --  Frontal lobe and executive function deficit following --  Impact on safety and function Severe aspiration risk;Risk for inadequate nutrition/hydration      CHL IP TREATMENT RECOMMENDATION 10/17/2018  Treatment Recommendations Therapy as outlined in treatment plan below     Prognosis 10/17/2018  Prognosis for Safe Diet Advancement Guarded  Barriers to Reach Goals Severity of deficits;Other (Comment)  Barriers/Prognosis Comment premorbid deficits dating back to at least 2016 - pt underwent mbs 2017 with recommendation for npo    CHL IP DIET RECOMMENDATION 10/17/2018  SLP Diet Recommendations NPO;Ice chips PRN after oral care  Liquid Administration via Spoon  Medication Administration Via alternative means  Compensations Multiple dry swallows after each bite/sip  Postural Changes --      No flowsheet data found.    CHL IP FOLLOW UP RECOMMENDATIONS 10/17/2018  Follow up Recommendations (No Data)      CHL IP FREQUENCY AND DURATION 10/17/2018  Speech Therapy Frequency (ACUTE ONLY) min 2x/week  Treatment Duration 1  week           CHL IP ORAL PHASE 10/17/2018  Oral Phase Impaired  Oral - Pudding Teaspoon --  Oral - Pudding Cup --  Oral - Honey Teaspoon --  Oral - Honey Cup --  Oral - Nectar Teaspoon Delayed oral transit;Weak lingual  manipulation  Oral - Nectar Cup Delayed oral transit;Weak lingual manipulation  Oral - Nectar Straw Weak lingual manipulation;Delayed oral transit  Oral - Thin Teaspoon Weak lingual manipulation;Delayed oral transit  Oral - Thin Cup Weak lingual manipulation;Delayed oral transit  Oral - Thin Straw Weak lingual manipulation;Delayed oral transit  Oral - Puree Weak lingual manipulation;Delayed oral transit;Lingual pumping  Oral - Mech Soft NT  Oral - Regular --  Oral - Multi-Consistency --  Oral - Pill NT  Oral Phase - Comment --    CHL IP PHARYNGEAL PHASE 10/17/2018  Pharyngeal Phase Impaired  Pharyngeal- Pudding Teaspoon --  Pharyngeal --  Pharyngeal- Pudding Cup --  Pharyngeal --  Pharyngeal- Honey Teaspoon --  Pharyngeal --  Pharyngeal- Honey Cup --  Pharyngeal --  Pharyngeal- Nectar Teaspoon Reduced laryngeal elevation;Reduced anterior laryngeal mobility;Reduced airway/laryngeal closure;Reduced epiglottic inversion;Pharyngeal residue - valleculae;Pharyngeal residue - pyriform;Reduced pharyngeal peristalsis  Pharyngeal Material does not enter airway  Pharyngeal- Nectar Cup Reduced epiglottic inversion;Reduced anterior laryngeal mobility;Reduced laryngeal elevation;Reduced airway/laryngeal closure;Reduced tongue base retraction;Pharyngeal residue - valleculae;Pharyngeal residue - pyriform;Reduced pharyngeal peristalsis  Pharyngeal Material does not enter airway  Pharyngeal- Nectar Straw Reduced laryngeal elevation;Reduced anterior laryngeal mobility;Reduced epiglottic inversion;Reduced pharyngeal peristalsis;Reduced airway/laryngeal closure;Pharyngeal residue - valleculae;Pharyngeal residue - pyriform;Reduced tongue base retraction  Pharyngeal Material does not enter airway  Pharyngeal- Thin Teaspoon Reduced epiglottic inversion;Reduced anterior laryngeal mobility;Reduced laryngeal elevation;Reduced airway/laryngeal closure;Reduced tongue base retraction;Penetration/Aspiration during  swallow;Pharyngeal residue - valleculae;Pharyngeal residue - pyriform;Reduced pharyngeal peristalsis  Pharyngeal Material enters airway, remains ABOVE vocal cords and not ejected out  Pharyngeal- Thin Cup Reduced pharyngeal peristalsis;Reduced epiglottic inversion;Reduced anterior laryngeal mobility;Reduced airway/laryngeal closure;Reduced tongue base retraction;Reduced laryngeal elevation;Pharyngeal residue - valleculae;Pharyngeal residue - pyriform;Penetration/Aspiration during swallow;Penetration/Apiration after swallow  Pharyngeal Material enters airway, CONTACTS cords and not ejected out  Pharyngeal- Thin Straw Reduced epiglottic inversion;Reduced anterior laryngeal mobility;Reduced laryngeal elevation;Reduced airway/laryngeal closure;Reduced tongue base retraction;Pharyngeal residue - valleculae;Pharyngeal residue - pyriform  Pharyngeal Material enters airway, passes BELOW cords and not ejected out despite cough attempt by patient  Pharyngeal- Puree Reduced epiglottic inversion;Reduced anterior laryngeal mobility;Reduced laryngeal elevation;Reduced airway/laryngeal closure;Reduced tongue base retraction;Pharyngeal residue - valleculae;Reduced pharyngeal peristalsis  Pharyngeal Material does not enter airway  Pharyngeal- Mechanical Soft NT  Pharyngeal --  Pharyngeal- Regular --  Pharyngeal --  Pharyngeal- Multi-consistency --  Pharyngeal --  Pharyngeal- Pill NT  Pharyngeal --  Pharyngeal Comment --     CHL IP CERVICAL ESOPHAGEAL PHASE 10/17/2018  Cervical Esophageal Phase Impaired  Pudding Teaspoon --  Pudding Cup --  Honey Teaspoon --  Honey Cup --  Nectar Teaspoon Reduced cricopharyngeal relaxation  Nectar Cup Reduced cricopharyngeal relaxation  Nectar Straw Reduced cricopharyngeal relaxation  Thin Teaspoon Reduced cricopharyngeal relaxation  Thin Cup Reduced cricopharyngeal relaxation  Thin Straw Reduced cricopharyngeal relaxation  Puree Reduced cricopharyngeal relaxation   Mechanical Soft --  Regular --  Multi-consistency --  Pill --  Cervical Esophageal Comment --     Macario Golds 10/17/2018, 9:21 AM                       Luanna Salk, MS [facility] Pager [phone] Office [phone]

## 2018-10-17 NOTE — Progress Notes (Signed)
Physical Therapy Treatment Patient Details Name: Mason Webb. MRN: [id number] DOB: [birth year] Today's Date: 10/17/2018    History of Present Illness 84 y.o. male with PMH of HTN, HLD and stroke with unknown residual  Deficits  presented for acute onset of confusion and left facial droop as per family; per neurology, non-focal exam, AMS present, but possible not stroke; likely UTI with encephalopathy    PT Comments    Pt with noted apraxia during gait and functional tasks. Needs max verbal and tactile cues to initiate and complete mobility and tasks. Pt ambulated 15' with RW and min A +2. Continue to recommend SNF at d/c. PT will continue to follow.    Follow Up Recommendations  SNF     Equipment Recommendations  Rolling walker with 5" wheels;3in1 (PT)    Recommendations for Other Services       Precautions / Restrictions Precautions Precautions: Fall Restrictions Weight Bearing Restrictions: No    Mobility  Bed Mobility Overal bed mobility: Needs Assistance Bed Mobility: Supine to Sit Rolling: (P) Mod assist Sidelying to sit: (P) Min assist Supine to sit: Mod assist;+2 for safety/equipment Sit to supine: Mod assist;+2 for safety/equipment   General bed mobility comments: max verbal and tactile cues to initiate, once pt sitting up, kept leaning over to L elbow as if he was going to lie back down.   Transfers Overall transfer level: Needs assistance Equipment used: Rolling walker (2 wheeled) Transfers: Sit to/from Stand Sit to Stand: Min assist;+2 safety/equipment         General transfer comment: pt with physical ability to power up but min A +2 given for safety and cues  Ambulation/Gait Ambulation/Gait assistance: Min assist;+2 physical assistance Gait Distance (Feet): 15 Feet Assistive device: Rolling walker (2 wheeled) Gait Pattern/deviations: Step-through pattern;Decreased stride length;Trunk flexed Gait velocity: decreased Gait velocity  interpretation: <1.31 ft/sec, indicative of household ambulator General Gait Details: decreased wt shift, easily distracted, cues needed to keep hold of RW, at times mesing with condom catheter   Stairs             Wheelchair Mobility    Modified Rankin (Stroke Patients Only)       Balance Overall balance assessment: Needs assistance Sitting-balance support: Feet supported;Bilateral upper extremity supported Sitting balance-Leahy Scale: Fair     Standing balance support: No upper extremity supported;Bilateral upper extremity supported Standing balance-Leahy Scale: Poor Standing balance comment: reliant on UE support                            Cognition Arousal/Alertness: Awake/alert Behavior During Therapy: Flat affect Overall Cognitive Status: No family/caregiver present to determine baseline cognitive functioning Area of Impairment: Attention;Memory;Following commands;Safety/judgement;Awareness;Problem solving                 Orientation Level: Disoriented to;Place;Time;Situation Current Attention Level: Sustained Memory: Decreased short-term memory Following Commands: Follows one step commands inconsistently;Follows one step commands with increased time Safety/Judgement: Decreased awareness of safety;Decreased awareness of deficits Awareness: Intellectual Problem Solving: Slow processing;Decreased initiation;Difficulty sequencing;Requires verbal cues;Requires tactile cues General Comments: pt has difficulty initiating movement on commands and needs tactile cues. Shows apraxic movements during function      Exercises      General Comments        Pertinent Vitals/Pain Pain Assessment: No/denies pain    Home Living Family/patient expects to be discharged to:: (P) Unsure  Additional Comments: (P) pt poor historian, unsure if information provided is accurate.    Prior Function Level of Independence: (P) (unsure)       Comments: (P) Pt unable to provide   PT Goals (current goals can now be found in the care plan section) Acute Rehab PT Goals Patient Stated Goal: did not state PT Goal Formulation: Patient unable to participate in goal setting Time For Goal Achievement: 10/29/18 Potential to Achieve Goals: Fair Progress towards PT goals: Progressing toward goals    Frequency    Min 2X/week      PT Plan Current plan remains appropriate    Co-evaluation PT/OT/SLP Co-Evaluation/Treatment: Yes Reason for Co-Treatment: Complexity of the patient's impairments (multi-system involvement);For patient/therapist safety PT goals addressed during session: Mobility/safety with mobility;Balance;Proper use of DME        AM-PAC PT "6 Clicks" Mobility   Outcome Measure  Help needed turning from your back to your side while in a flat bed without using bedrails?: A Lot Help needed moving from lying on your back to sitting on the side of a flat bed without using bedrails?: A Lot Help needed moving to and from a bed to a chair (including a wheelchair)?: A Lot Help needed standing up from a chair using your arms (e.g., wheelchair or bedside chair)?: A Little Help needed to walk in hospital room?: A Lot Help needed climbing 3-5 steps with a railing? : Total 6 Click Score: 12    End of Session Equipment Utilized During Treatment: Gait belt Activity Tolerance: Patient tolerated treatment well Patient left: in chair;with call bell/phone within reach;with chair alarm set Nurse Communication: Mobility status PT Visit Diagnosis: Unsteadiness on feet (R26.81);Other abnormalities of gait and mobility (R26.89);Muscle weakness (generalized) (M62.81)     Time: 9233-0076 PT Time Calculation (min) (ACUTE ONLY): 26 min  Charges:  $Gait Training: 8-22 mins                     Lyanne Co, PT  Acute Rehab Services  Pager [phone] Office [phone]    Lawana Chambers Dresean Beckel 10/17/2018, 4:28 PM

## 2018-10-17 NOTE — Evaluation (Signed)
Clinical/Bedside Swallow Evaluation Patient Details  Name: Mason Webb. MRN: [id number] Date of Birth: 1934/07/22  Today's Date: 10/17/2018 Time: SLP Start Time (ACUTE ONLY): 0735 SLP Stop Time (ACUTE ONLY): 0755 SLP Time Calculation (min) (ACUTE ONLY): 20 min  Past Medical History:  Past Medical History:  Diagnosis Date  . CAD (coronary artery disease)   . Hemorrhagic stroke ([place])   . Hyperlipidemia   . Hypertension    Past Surgical History:  Past Surgical History:  Procedure Laterality Date  . PEG PLACEMENT     HPI:  84 yo male adm to [facility] with AMS - MRI negative except for old gangionic, left thalamic and caudate cva.  Pt cxr showed low lung volumes.  Pt reportedly resides with his wife.  He choked on pills.   Assessment / Plan / Recommendation Clinical Impression  Patient presents with clinical indications of oropharyngeal dysphagia c/b multiple swallows, frequent belching and coughing post-swallow.  Pt reports having problems swallowing pills prior to admission. Pt is disoriented to place and date. SLP Visit Diagnosis: Dysphagia, oropharyngeal phase (R13.12);Dysphagia, pharyngoesophageal phase (R13.14)(clinical indication of dysphagia)    Aspiration Risk  Severe aspiration risk;Risk for inadequate nutrition/hydration    Diet Recommendation NPO except meds   Medication Administration: Crushed with puree    Other  Recommendations     Follow up Recommendations (tbd)      Frequency and Duration     N/A       Prognosis    GUARDED, uncertain why pt has h/o PEG    Swallow Study   General Date of Onset: 10/17/18 HPI: 84 yo male adm to [facility] with AMS - MRI negative except for old gangionic, left thalamic and caudate cva.  Pt cxr showed low lung volumes.  Pt reportedly  resides with his wife.  He choked on pills. Type of Study: Bedside Swallow Evaluation Diet Prior to this Study: NPO Temperature Spikes Noted: No Respiratory Status: Room air History of Recent Intubation: No Behavior/Cognition: Alert;Cooperative;Pleasant mood Oral Cavity Assessment: Within Functional Limits Oral Care Completed by SLP: No Oral Cavity - Dentition: Edentulous Self-Feeding Abilities: (able to hold his own cup) Patient Positioning: Upright in bed Baseline Vocal Quality: Normal Volitional Cough: Cognitively unable to elicit Volitional Swallow: Unable to elicit    Oral/Motor/Sensory Function Overall Oral Motor/Sensory Function: Generalized oral weakness(? mild right facial asymmetry - lower)   Ice Chips Ice chips: Not tested   Thin Liquid Thin Liquid: Impaired Presentation: Straw Pharyngeal  Phase Impairments: Multiple swallows;Cough - Immediate    Nectar Thick Nectar Thick Liquid: Impaired Presentation: Cup Pharyngeal Phase Impairments: Multiple swallows;Cough - Delayed   Honey Thick Honey Thick Liquid: Not tested   Puree Puree: Within functional limits Presentation: Self Fed;Spoon   Solid     Solid: Not tested      Macario Golds 10/17/2018,8:12 AM   Luanna Salk, MS [facility] SLP Acute Rehab Services Pager [phone] Office [phone]

## 2018-10-17 NOTE — Progress Notes (Signed)
OT Cancellation Note  Patient Details Name: Mason Webb. MRN: [id number] DOB: Dec 13, 1934   Cancelled Treatment:    Reason Eval/Treat Not Completed: Patient at procedure or test/ unavailable(Pt at swallow study. Will return later as schedule allows.)  Lequita Halt, OT Student  10/17/2018, 9:38 AM

## 2018-10-18 DIAGNOSIS — R627 Adult failure to thrive: Secondary | ICD-10-CM

## 2018-10-18 LAB — GLUCOSE, CAPILLARY: Glucose-Capillary: 144 mg/dL — ABNORMAL HIGH (ref 70–99)

## 2018-10-18 NOTE — Progress Notes (Signed)
Patient ID: Belva Chimes., male   DOB: August 06, 1934, 84 y.o.   MRN: [id number]  This NP reviewed medical records, spoke to team for palliative medicine needs and support.  As discussed yesterday with family focus of care is comfort and dignity, allowing for a natural death.  Patient continues to decline physically and functionally.  Taking very little by mouth, high risk for aspiration.  Patient is high risk for decompensation 2/2 to dysphagia and poor po intake, untreated infection, altered mental status, and under lying co-morbid ites.  Discussed with Dr Loleta Books need for reassessment in 24-48 hours for appropriate transition of care.  Family cannot care for him at home, disposition is  Residential hospice/[facility] vs SNF with hospice.  PMT will reassess on Friday morning.   Discussed with Dr Loleta Books and bedside RN and Liz/LCSW  Total time spent on the unit was 35 minutes  Greater than 50% of the time was spent in counseling and coordination of care  Wadie Lessen NP  Palliative Medicine Team Team Phone # [phone] Pager [phone]

## 2018-10-18 NOTE — Progress Notes (Signed)
Responded to palliative spiritual care consult. PT was awake and open for a visit. No family present. Corle said that he feels fine. He responded mostly with one word answers. I noticed that he seemed a little confused. He said that he had his pastor come visit yesterday and both his sisters  Came to see him the day before. He said that he goes to a [employer].  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 He requested prayer. I offered spiritual care with words of encouragement, ministry of presence, and prayer.   Palliative Care Resident  Chaplain Fidel Levy

## 2018-10-18 NOTE — Progress Notes (Signed)
Palliative Medicine RN Note: Patient is awake and answering some yes or no questions. Asked by PMT NP Wadie Lessen to see how much he eats/drinks. Goal is comfort; question whether he is appropriate for residential hospice or needs SNF hospice.  Mason Webb was slumped over in the bed, head against the rail, feet over the edge of the bed. He was completely unable to assist with repositioning and got confused when we moved him.   Breakfast tray was on bedside table, unopened, upon my arrival at 1030. Note sign from SLP over bed stating NPO except ice chips & small sips of water. Chopped soft breakfast with thickened water, regular grape juice, and apple sauce, as well as milk and coffee.   Mason Webb ate approximately 6 small bites of apple sauce. I offered the grape juice, and he swallowed about 50 ml before getting tired. He tasted the chopped pancakes, but didn't want to swallow, spitting it out. When asked if he was tired of swallowing, he replied yes. I offered to continue to give him sips of grape juice or swallows of apple sauce, but he declined.  During my time with him, he did ask me questions, such as "Where have you been?" and "Where does your family live". He did not continue conversation after these questions, instead mimicking my answers.  I updated RN on need to feed by hand and comfort care status. He asked about stopping CBGs. I will speak to Wadie Lessen to provide update and ask about new orders.  Mason Webb Mason Grahn, RN, BSN, [facility] Palliative Medicine Team 10/18/2018 11:02 AM Office [phone]   Addendum: Mason Webb reports that patient has a diet order (not NPO) and that CBGs should have been stopped yesterday.   Mason Webb Mason Zapata, RN, BSN, [facility] Palliative Medicine Team 10/18/2018 11:05 AM Office [phone]

## 2018-10-18 NOTE — Progress Notes (Addendum)
PROGRESS NOTE    Mason LoserFoster Riviera Jr.  ZOX:[id number] DOB: 02/10/1934 DOA: 10/14/2018 PCP: Zoila ShutterWoodyear, Wynne E, MD   Brief Narrative:  84 y.o. M who [facility] with reports of altered mental status, confusion, questionable left facial droop and LUE neglect.  Found to have fever of 100.3 in ER and UA concerning for UTI. [facility] Head without acute hemorrhage or infarct (did show chronic lacunar infarcts, atrophy).  CVA ruled out with negative MRI (Neurology felt not a TIA, related to infection).  UDS negative. UC grew 100k proteus mirabilis.  The patient was treated with IV antibiotics.  Given family history of decline over the past year, Palliative Care consulted for GOC.  Family elected for DNR / DNI, no further aggressive interventions.  Symptom control and comfort feeding.         Assessment & Plan:  Acute Metabolic Encephalopathy Dementia  Failure to thrive -in setting of UTI superimposed on dementia.  Here patient has had poor p.o. intake, appears to be gradually failing to thrive. P: Appreciate Neurology evaluation  Limit sedating medications, control fever  Seroquel 50 mg QHS PRN  Follow up blood cultures  Consult palliative care, appreciate recommendations  Proteus Mirabilis UTI  -UA with large leukocytes, nitrite positive, many bacteria, >50 WBC -UC with > 100k proteus, S- ceftriaxone  P: S/P 4 days abx, family elected for no further medical interventions / life prolonging measures  Dysphagia  -ongoing for at least 3 years, hx of PEG, since removed P: Appreciate SLP evaluation.  Continue D3 Diet with thin liquids > comfort feeding.  Family accepts risk of aspiration.   HTN P: Continue norvasc, hydralazine, losartan, Toprol-XL Hold lasix   HLD  P: Continue pravastatin   Gout P: Continue allopruinol  Follow intermittent renal function   Goals of Care P: Palliative Care following, appreciate assistance  DNR /  DNI  Family wishes for no further diagnostics, or life prolonging measures  PRN morphine for pain / SOB     DVT prophylaxis: SCD's  Code Status: DNR  Family Communication: Daughter Arlana Hove([place]) updated via phone 10/7.  Await placement at SNF.  Will return call with update once bed available.   Medical decision making The below labs and imaging reports reviewed and summarized above.  Medication management as above.  The patient was admitted with UTI, and encephalopathy.  He has had a substantial functional and cognitive decline since prior to admission, and we are concerned at this point that he may not be able to take enough nutrition and fluids to sustain and that he may be failing to thrive and his dementia progressed to end-stage is.  Appreciate palliative care input.  He is normally able to stand and participate in self cares, but currently is unable to reposition self in bed, is taking only few bites of food at a time, minimal fluid intake.  Suspect that he cannot keep up with fluid needs, likely has weeks to months to live.      Consultants:   Palliative Care   Procedures:     Antimicrobials:   Cefepime 10/3 >> 10/4   Vanco 10/3 >> 10/4   Ceftriaxone 10/4 >> 10/6     Subjective: Pt denies complaints.  Afebrile.    Objective: Vitals:   10/17/18 1559 10/17/18 1934 10/17/18 2321 10/18/18 0321  BP: (!) 182/109 (!) 190/99 (!) 162/95 (!) 159/87  Pulse: 77 73 79 77  Resp: 18 18 19  18  Temp: 97.9 F (36.6 C) 97.9 F (36.6 C) 97.6 F (36.4 C) 97.8 F (36.6 C)  TempSrc: Oral Oral Oral Oral  SpO2: 98% 100% 100% 100%    Intake/Output Summary (Last 24 hours) at 10/18/2018 1610 Last data filed at 10/17/2018 2334 Gross per 24 hour  Intake --  Output 600 ml  Net -600 ml   There were no vitals filed for this visit.  Examination: General appearance: frail elderly adult male, alert and in no acute distress.   HEENT: Anicteric, conjunctiva pink, lids and lashes normal.  No nasal deformity, discharge, epistaxis.  Lips moist, increased posterior secretions.   Skin: Warm and dry.  No jaundice.  No suspicious rashes or lesions. Cardiac: RRR, nl S1-S2, no murmurs appreciated.  Capillary refill is brisk, <3 sec.  JVP normal/not visible.  No LE edema.  Radial pulses 2+ and symmetric. Respiratory: Normal respiratory rate and rhythm.  CTAB without rales or wheezes. Abdomen: Abdomen soft.  Non TTP. No ascites, distension, hepatosplenomegaly.   MSK: No deformities or effusions. Neuro: Awake and alert to self, location.  EOMI, moves all extremities. Speech clear but increased posterior secretions make difficult to understand.    Psych: Sensorium intact and responding to questions, Attention normal. Affect flat.      Data Reviewed: I have personally reviewed following labs and imaging studies:  CBC: Recent Labs  Lab 10/14/18 1737 10/14/18 1739 10/15/18 0243  WBC 9.0  --  10.3  NEUTROABS 7.2  --   --   HGB 10.8* 12.2* 10.0*  HCT 32.6* 36.0* 31.2*  MCV 93.1  --  93.4  PLT 320  --  276   Basic Metabolic Panel: Recent Labs  Lab 10/14/18 1737 10/14/18 1739 10/15/18 0243  NA 143 142 142  K 4.1 4.0 3.9  CL 105 105 104  CO2 27  --  27  GLUCOSE 131* 122* 114*  BUN CREATININE 0.96 0.80 0.83  CALCIUM 9.2  --  8.8*   GFR: CrCl cannot be calculated (Unknown ideal weight.). Liver Function Tests: Recent Labs  Lab 10/14/18 1737 10/15/18 0243  AST 18 14*  ALT 12 7  ALKPHOS 50 51  BILITOT 0.7 0.4  PROT 7.3 6.5  ALBUMIN 3.2* 2.6*   No results for input(s): LIPASE, AMYLASE in the last 168 hours. Recent Labs  Lab 10/14/18 1848  AMMONIA 53*   Coagulation Profile: Recent Labs  Lab 10/14/18 1737  INR 1.0   Cardiac Enzymes: No results for input(s): CKTOTAL, CKMB, CKMBINDEX, TROPONINI in the last 168 hours.   BNP (last 3 results) No results for input(s): PROBNP in the last 8760 hours.   HbA1C: No results for input(s): HGBA1C in the last  72 hours.   CBG: Recent Labs  Lab 10/16/18 1959 10/17/18 0018 10/17/18 0501 10/17/18 1227 10/17/18 1651  GLUCAP 111* 118* 135* 53* 95   Lipid Profile: No results for input(s): CHOL, HDL, LDLCALC, TRIG, CHOLHDL, LDLDIRECT in the last 72 hours.   Thyroid Function Tests: No results for input(s): TSH, T4TOTAL, FREET4, T3FREE, THYROIDAB in the last 72 hours.   Anemia Panel: No results for input(s): VITAMINB12, FOLATE, FERRITIN, TIBC, IRON, RETICCTPCT in the last 72 hours.   Urine analysis:    Component Value Date/Time   COLORURINE YELLOW 10/14/2018 2105   APPEARANCEUR CLOUDY (A) 10/14/2018 2105   LABSPEC 1.016 10/14/2018 2105   PHURINE 8.0 10/14/2018 2105   GLUCOSEU NEGATIVE 10/14/2018 2105   HGBUR NEGATIVE 10/14/2018 2105   BILIRUBINUR  NEGATIVE 10/14/2018 2105   KETONESUR NEGATIVE 10/14/2018 2105   PROTEINUR 30 (A) 10/14/2018 2105   NITRITE POSITIVE (A) 10/14/2018 2105   LEUKOCYTESUR LARGE (A) 10/14/2018 2105   Sepsis Labs: (procalcitonin:4,lacticacidven:4)  ) Recent Results (from the past 240 hour(s))  SARS Coronavirus 2 [facility] order, Performed in [facility] hospital lab) Nasopharyngeal Nasopharyngeal Swab     Status: None   Collection Time: 10/14/18  6:55 PM   Specimen: Nasopharyngeal Swab  Result Value Ref Range Status   SARS Coronavirus 2 NEGATIVE NEGATIVE Final    Comment: (NOTE) If result is NEGATIVE SARS-CoV-2 target nucleic acids are NOT DETECTED. The SARS-CoV-2 RNA is generally detectable in upper and lower  respiratory specimens during the acute phase of infection. The lowest  concentration of SARS-CoV-2 viral copies this assay can detect is 250  copies / mL. A negative result does not preclude SARS-CoV-2 infection  and should not be used as the sole basis for treatment or other  patient management decisions.  A negative result may occur with  improper specimen collection / handling, submission of specimen other  than nasopharyngeal swab,  presence of viral mutation(s) within the  areas targeted by this assay, and inadequate number of viral copies  (<250 copies / mL). A negative result must be combined with clinical  observations, patient history, and epidemiological information. If result is POSITIVE SARS-CoV-2 target nucleic acids are DETECTED. The SARS-CoV-2 RNA is generally detectable in upper and lower  respiratory specimens dur ing the acute phase of infection.  Positive  results are indicative of active infection with SARS-CoV-2.  Clinical  correlation with patient history and other diagnostic information is  necessary to determine patient infection status.  Positive results do  not rule out bacterial infection or co-infection with other viruses. If result is PRESUMPTIVE POSTIVE SARS-CoV-2 nucleic acids MAY BE PRESENT.   A presumptive positive result was obtained on the submitted specimen  and confirmed on repeat testing.  While 2019 novel coronavirus  (SARS-CoV-2) nucleic acids may be present in the submitted sample  additional confirmatory testing may be necessary for epidemiological  and / or clinical management purposes  to differentiate between  SARS-CoV-2 and other Sarbecovirus currently known to infect humans.  If clinically indicated additional testing with an alternate test  methodology [phone]) is advised. The SARS-CoV-2 RNA is generally  detectable in upper and lower respiratory sp ecimens during the acute  phase of infection. The expected result is Negative. Fact Sheet for Patients:  BoilerBrush.com.cy Fact Sheet for Healthcare Providers: https://pope.com/ This test is not yet approved or cleared by the [place] FDA and has been authorized for detection and/or diagnosis of SARS-CoV-2 by FDA under an Emergency Use Authorization (EUA).  This EUA will remain in effect (meaning this test can be used) for the duration of the COVID-19 declaration under  Section 564(b)(1) of the Act, 21 U.S.C. section 360bbb-3(b)(1), unless the authorization is terminated or revoked sooner. Performed at [facility], [address], [place], [place] [address]   Blood Culture (routine x 2)     Status: None (Preliminary result)   Collection Time: 10/14/18  7:50 PM   Specimen: BLOOD  Result Value Ref Range Status   Specimen Description BLOOD RIGHT NECK  Final   Special Requests   Final    BOTTLES DRAWN AEROBIC AND ANAEROBIC Blood Culture results may not be optimal due to an inadequate volume of blood received in culture bottles   Culture   Final  NO GROWTH 3 DAYS Performed at [facility], [address], [place], [place] [address]    Report Status PENDING  Incomplete  Urine culture     Status: Abnormal   Collection Time: 10/14/18  9:05 PM   Specimen: In/Out Cath Urine  Result Value Ref Range Status   Specimen Description IN/OUT CATH URINE  Final   Special Requests   Final    NONE Performed at [facility], [address], [place], [place] [address]    Culture >=100,000 COLONIES/mL PROTEUS MIRABILIS (A)  Final   Report Status 10/17/2018 FINAL  Final   Organism ID, Bacteria PROTEUS MIRABILIS (A)  Final      Susceptibility   Proteus mirabilis - MIC*    AMPICILLIN <=2 SENSITIVE Sensitive     CEFAZOLIN <=4 SENSITIVE Sensitive     CEFTRIAXONE <=1 SENSITIVE Sensitive     CIPROFLOXACIN <=0.25 SENSITIVE Sensitive     GENTAMICIN <=1 SENSITIVE Sensitive     IMIPENEM 2 SENSITIVE Sensitive     NITROFURANTOIN 256 RESISTANT Resistant     TRIMETH/SULFA <=20 SENSITIVE Sensitive     AMPICILLIN/SULBACTAM <=2 SENSITIVE Sensitive     PIP/TAZO <=4 SENSITIVE Sensitive     * >=100,000 COLONIES/mL PROTEUS MIRABILIS      Radiology Studies: Dg Swallowing Func-speech Pathology  Result Date: 10/17/2018 Objective Swallowing Evaluation: Type of Study: MBS-Modified Barium Swallow Study  Patient Details Name: Mason Daoust. MRN: [id number] Date  of Birth: 05-Oct-1934 Today's Date: 10/17/2018 Time: SLP Start Time (ACUTE ONLY): 8657 -SLP Stop Time (ACUTE ONLY): 0859 SLP Time Calculation (min) (ACUTE ONLY): 24 min Past Medical History: Past Medical History: Diagnosis Date  CAD (coronary artery disease)   Hemorrhagic stroke (HCC)   Hyperlipidemia   Hypertension  Past Surgical History: Past Surgical History: Procedure Laterality Date  PEG PLACEMENT   HPI: 84 yo male adm to [facility] with AMS - MRI negative except for old gangionic, left thalamic and caudate cva.  Pt cxr showed low lung volumes.  Pt reportedly resides with his wife.  He choked on pills. Per chart review, pt has h/o PEG tube placement- ? indication?  Subjective: pt awake in chair Assessment / Plan / Recommendation CHL IP CLINICAL IMPRESSIONS 10/17/2018 Clinical Impression The patient presents with moderate oral dysphagia characterized by delayed oral transit, lingual pumping and premature spill.  Severe Pharyngeal dysphagia with decreased epiglottic deflection, decreased hyo-laryngeal excursion, little to no pharyngeal constriction resulting in gross vallecula and moderate pyriform residue (that mix with secretions). The patient was observed to swallow multiple times inconsistently in attempt to clear the pharyngeal residue and generally was not successful. Head turn right did not decrease accumulation in pharynx. Cued "Hock" was not adequate and thus no residual was removed.  Cricopharyngeal opening appeared to be inadequate.  Pt with gross retention (mixed with secretions) in phayrnx without consistent sensation nor ability to clear.  Laryngeal penetration noted with all liquids with pt demonstrating reflexive throat clear but this did not successfully clear material from the laryngeal vestibule.  Given h/o dysphagia requiring PEG placement, suspect this is an exacerbation of baseline difficulties. Given pt's advanced age, dementia and acute on chronic dysphagia (dating back to at least 2016) -  recommend to consider a palliative consult to establish goals of care.  Pt admits he is losing weight and has been coughing with intake prior to admit.  Rec npo except tsps water and ice chips at this time. Marland Kitchen SLP Visit Diagnosis Dysphagia, oropharyngeal phase (  R13.12);Dysphagia, pharyngoesophageal phase (R13.14) Attention and concentration deficit following -- Frontal lobe and executive function deficit following -- Impact on safety and function Severe aspiration risk;Risk for inadequate nutrition/hydration   CHL IP TREATMENT RECOMMENDATION 10/17/2018 Treatment Recommendations Therapy as outlined in treatment plan below   Prognosis 10/17/2018 Prognosis for Safe Diet Advancement Guarded Barriers to Reach Goals Severity of deficits;Other (Comment) Barriers/Prognosis Comment premorbid deficits dating back to at least 2016 - pt underwent mbs 2017 with recommendation for npo CHL IP DIET RECOMMENDATION 10/17/2018 SLP Diet Recommendations NPO;Ice chips PRN after oral care Liquid Administration via Spoon Medication Administration Via alternative means Compensations Multiple dry swallows after each bite/sip Postural Changes --   No flowsheet data found.  CHL IP FOLLOW UP RECOMMENDATIONS 10/17/2018 Follow up Recommendations (No Data)   CHL IP FREQUENCY AND DURATION 10/17/2018 Speech Therapy Frequency (ACUTE ONLY) min 2x/week Treatment Duration 1 week      CHL IP ORAL PHASE 10/17/2018 Oral Phase Impaired Oral - Pudding Teaspoon -- Oral - Pudding Cup -- Oral - Honey Teaspoon -- Oral - Honey Cup -- Oral - Nectar Teaspoon Delayed oral transit;Weak lingual manipulation Oral - Nectar Cup Delayed oral transit;Weak lingual manipulation Oral - Nectar Straw Weak lingual manipulation;Delayed oral transit Oral - Thin Teaspoon Weak lingual manipulation;Delayed oral transit Oral - Thin Cup Weak lingual manipulation;Delayed oral transit Oral - Thin Straw Weak lingual manipulation;Delayed oral transit Oral - Puree Weak lingual manipulation;Delayed  oral transit;Lingual pumping Oral - Mech Soft NT Oral - Regular -- Oral - Multi-Consistency -- Oral - Pill NT Oral Phase - Comment --  CHL IP PHARYNGEAL PHASE 10/17/2018 Pharyngeal Phase Impaired Pharyngeal- Pudding Teaspoon -- Pharyngeal -- Pharyngeal- Pudding Cup -- Pharyngeal -- Pharyngeal- Honey Teaspoon -- Pharyngeal -- Pharyngeal- Honey Cup -- Pharyngeal -- Pharyngeal- Nectar Teaspoon Reduced laryngeal elevation;Reduced anterior laryngeal mobility;Reduced airway/laryngeal closure;Reduced epiglottic inversion;Pharyngeal residue - valleculae;Pharyngeal residue - pyriform;Reduced pharyngeal peristalsis Pharyngeal Material does not enter airway Pharyngeal- Nectar Cup Reduced epiglottic inversion;Reduced anterior laryngeal mobility;Reduced laryngeal elevation;Reduced airway/laryngeal closure;Reduced tongue base retraction;Pharyngeal residue - valleculae;Pharyngeal residue - pyriform;Reduced pharyngeal peristalsis Pharyngeal Material does not enter airway Pharyngeal- Nectar Straw Reduced laryngeal elevation;Reduced anterior laryngeal mobility;Reduced epiglottic inversion;Reduced pharyngeal peristalsis;Reduced airway/laryngeal closure;Pharyngeal residue - valleculae;Pharyngeal residue - pyriform;Reduced tongue base retraction Pharyngeal Material does not enter airway Pharyngeal- Thin Teaspoon Reduced epiglottic inversion;Reduced anterior laryngeal mobility;Reduced laryngeal elevation;Reduced airway/laryngeal closure;Reduced tongue base retraction;Penetration/Aspiration during swallow;Pharyngeal residue - valleculae;Pharyngeal residue - pyriform;Reduced pharyngeal peristalsis Pharyngeal Material enters airway, remains ABOVE vocal cords and not ejected out Pharyngeal- Thin Cup Reduced pharyngeal peristalsis;Reduced epiglottic inversion;Reduced anterior laryngeal mobility;Reduced airway/laryngeal closure;Reduced tongue base retraction;Reduced laryngeal elevation;Pharyngeal residue - valleculae;Pharyngeal residue -  pyriform;Penetration/Aspiration during swallow;Penetration/Apiration after swallow Pharyngeal Material enters airway, CONTACTS cords and not ejected out Pharyngeal- Thin Straw Reduced epiglottic inversion;Reduced anterior laryngeal mobility;Reduced laryngeal elevation;Reduced airway/laryngeal closure;Reduced tongue base retraction;Pharyngeal residue - valleculae;Pharyngeal residue - pyriform Pharyngeal Material enters airway, passes BELOW cords and not ejected out despite cough attempt by patient Pharyngeal- Puree Reduced epiglottic inversion;Reduced anterior laryngeal mobility;Reduced laryngeal elevation;Reduced airway/laryngeal closure;Reduced tongue base retraction;Pharyngeal residue - valleculae;Reduced pharyngeal peristalsis Pharyngeal Material does not enter airway Pharyngeal- Mechanical Soft NT Pharyngeal -- Pharyngeal- Regular -- Pharyngeal -- Pharyngeal- Multi-consistency -- Pharyngeal -- Pharyngeal- Pill NT Pharyngeal -- Pharyngeal Comment --  CHL IP CERVICAL ESOPHAGEAL PHASE 10/17/2018 Cervical Esophageal Phase Impaired Pudding Teaspoon -- Pudding Cup -- Honey Teaspoon -- Honey Cup -- Nectar Teaspoon Reduced cricopharyngeal relaxation Nectar Cup Reduced cricopharyngeal relaxation Nectar Straw Reduced cricopharyngeal relaxation Thin Teaspoon Reduced cricopharyngeal relaxation Thin  Cup Reduced cricopharyngeal relaxation Thin Straw Reduced cricopharyngeal relaxation Puree Reduced cricopharyngeal relaxation Mechanical Soft -- Regular -- Multi-consistency -- Pill -- Cervical Esophageal Comment -- Macario Golds 10/17/2018, 9:24 AM Luanna Salk, MS [facility] SLP Acute Rehab Services Pager [phone] Office [phone]                   Scheduled Meds:  allopurinol  300 mg Oral q morning - 10a   amLODipine  2.5 mg Oral q morning - 10a   hydrALAZINE  50 mg Oral BID   losartan  25 mg Oral q morning - 10a   magnesium oxide  400 mg Oral Daily   metoprolol succinate  25 mg Oral q morning - 10a     pantoprazole  40 mg Oral Daily   Continuous Infusions:  sodium chloride 10 mL/hr at 10/17/18 0400     LOS: 3 days    Time spent: 30 minutes   Noe Gens, [place]    [facility] 10/18/2018, 8:23 AM     Please page through [place]:  [email or web address] Password [email or web address] If 7PM-7AM, please contact night-coverage     Attending MD Note:  I have seen and examined the patient with nurse practitioner/physician assistant and agree with the note above which has been edited to reflect our agreed upon history, exam, and assessment/plan.   I have personally reviewed the orders for the patient, which were made under my direction.    Mason Sandate. is a 84 y.o. male admitted with UTI and acute metabolic encephalopathy who now is failing to thrive.  Will monitor intake over next 24 hours, possibly to residential hospice in next 24-48 hours.    [place]

## 2018-10-18 NOTE — Progress Notes (Signed)
CSW received call from [facility] with palliative about patient's situation; currently does not seem to qualify for residential hospice, but also isn't SNF appropriate as family would like comfort care. Patient will likely be residential hospice appropriate in a few days, will continue to monitor.   CSW to follow to complete full assessment when appropriate disposition has been determined.   Laveda Abbe, [place] Clinical Social Worker [phone]

## 2018-10-19 LAB — CULTURE, BLOOD (ROUTINE X 2): Culture: NO GROWTH

## 2018-10-19 NOTE — Progress Notes (Addendum)
PROGRESS NOTE    Mason Webb.  ZOX:[id number] DOB: 1934/11/23 DOA: 10/14/2018 PCP: Zoila Shutter, MD   Brief Narrative:  Mason Webb is an 84 y.o. M with advanced dementia home dwelling, dCHF, hemorrhagic stroke, pAF not on AC due to fall risk, HTN, eosinophilic esophagitis, DM and CAD who presented with  Decreased sensorium/confusion and possible left facial droop and LUE neglect.    In the ER, noted to have temp 100.6F, UA concerning for UTI.        Assessment & Plan:  Acute Metabolic Encephalopathy due to UTI, dehydration Admitted and started on antibiotics for UTI.  Urine culture grew proteus.   There was some diagnostic ambiguity about whether he had a stroke, so MRI/MRI was negative for stroke and unremarkable other than generalized atrophy.  The patient's oral intake has improved somewhat.  He is able to follow cues to eat, and has some limited ability to participate in self-cares. P: -Continue Seroquel 50 mg QHS PRN     Proteus Mirabilis UTI  -UA with large leukocytes, nitrite positive, many bacteria, >50 WBC -UC with > 100k proteus, S- ceftriaxone.  Completed abx.  P: No further abx / aggressive medical interventions.   Acute on chronic Dysphagia  Ongoing for at least 3 years, hx of PEG, since removed P: -Continue comfort feeds.  Family accepts risk of aspiration.   HTN Stroke secondary prevention BP controlled P: -Continue Continue norvasc, hydralazine, losartan & toprol -Hold home lasix  -Continue pravastatin  Gout No active disease P: Continue allopurinol        DVT prophylaxis: SCD's  Code Status: DNR  Family Communication: Attempted to call daughter Alinda Money [phone]) with no answer.  Called second daughter Era Bumpers [phone]) and updated on patients status 10/8.  Medical decision making The below labs and imaging reports reviewed and summarized above.  Medication management as above.  The patient was  admitted with encephalopathy from dehydration and acute UTI.  He is received IV fluids and antibiotics, and the urinary tract infection is resolved.  At the moment although he is normally able to ambulate with a walker at baseline, and family are able to prompt him for self cares, he is currently requiring significant assistance with all ADLs, as well as toileting.  We will explore rehab, will continue to evaluate the patient's prognosis and goals of care.  Consultants:   Palliative Care   Procedures:     Antimicrobials:   Cefepime 10/3 >> 10/4   Vanco 10/3 >> 10/4   Ceftriaxone 10/4 >> 10/6     Subjective: Pt denies acute complaints. No issues overnight.  Afebrile.   Objective: Vitals:   10/18/18 0321 10/18/18 1342 10/18/18 1939 10/19/18 0741  BP: (!) 159/87 (!) 160/86 104/84 (!) 175/96  Pulse: 77 63 68 67  Resp: Temp: 97.8 F (36.6 C)  98 F (36.7 C) 98.3 F (36.8 C)  TempSrc: Oral  Oral Axillary  SpO2: 100%  97% 100%    Intake/Output Summary (Last 24 hours) at 10/19/2018 0913 Last data filed at 10/18/2018 2321 Gross per 24 hour  Intake 40 ml  Output 300 ml  Net -260 ml   There were no vitals filed for this visit.  Examination: General appearance: elderly gentleman lying in bed in NAD HEENT: MM pink/moist, no jvd Skin:  Warm/dry, no edema  Cardiac:  s1s2 rrr, SEM 4th ICS LSB Respiratory: non-labored on room air, lungs bilaterally clear without wheeze Abdomen: obese/soft,  bsx4 active, tolerating PO's  MSK: no acute deformities  Neuro:  Awake, alert, oriented to self / location, unable to state current year (1968), generalized weakness but moves all extremities  Psych:  Calm      Data Reviewed: I have personally reviewed following labs and imaging studies:  CBC: Recent Labs  Lab 10/14/18 1737 10/14/18 1739 10/15/18 0243  WBC 9.0  --  10.3  NEUTROABS 7.2  --   --   HGB 10.8* 12.2* 10.0*  HCT 32.6* 36.0* 31.2*  MCV 93.1  --  93.4  PLT 320   --  276   Basic Metabolic Panel: Recent Labs  Lab 10/14/18 1737 10/14/18 1739 10/15/18 0243  NA 143 142 142  K 4.1 4.0 3.9  CL 105 105 104  CO2 27  --  27  GLUCOSE 131* 122* 114*  BUN 15 20 14   CREATININE 0.96 0.80 0.83  CALCIUM 9.2  --  8.8*   GFR: CrCl cannot be calculated (Unknown ideal weight.). Liver Function Tests: Recent Labs  Lab 10/14/18 1737 10/15/18 0243  AST 18 14*  ALT 12 7  ALKPHOS 50 51  BILITOT 0.7 0.4  PROT 7.3 6.5  ALBUMIN 3.2* 2.6*   No results for input(s): LIPASE, AMYLASE in the last 168 hours. Recent Labs  Lab 10/14/18 1848  AMMONIA 53*   Coagulation Profile: Recent Labs  Lab 10/14/18 1737  INR 1.0   Cardiac Enzymes: No results for input(s): CKTOTAL, CKMB, CKMBINDEX, TROPONINI in the last 168 hours.   BNP (last 3 results) No results for input(s): PROBNP in the last 8760 hours.   HbA1C: No results for input(s): HGBA1C in the last 72 hours.   CBG: Recent Labs  Lab 10/17/18 0018 10/17/18 0501 10/17/18 1227 10/17/18 1651 10/18/18 1635  GLUCAP 118* 135* 53* 95 144*   Lipid Profile: No results for input(s): CHOL, HDL, LDLCALC, TRIG, CHOLHDL, LDLDIRECT in the last 72 hours.   Thyroid Function Tests: No results for input(s): TSH, T4TOTAL, FREET4, T3FREE, THYROIDAB in the last 72 hours.   Anemia Panel: No results for input(s): VITAMINB12, FOLATE, FERRITIN, TIBC, IRON, RETICCTPCT in the last 72 hours.   Urine analysis:    Component Value Date/Time   COLORURINE YELLOW 10/14/2018 2105   APPEARANCEUR CLOUDY (A) 10/14/2018 2105   LABSPEC 1.016 10/14/2018 2105   PHURINE 8.0 10/14/2018 2105   GLUCOSEU NEGATIVE 10/14/2018 2105   HGBUR NEGATIVE 10/14/2018 2105   BILIRUBINUR NEGATIVE 10/14/2018 2105   KETONESUR NEGATIVE 10/14/2018 2105   PROTEINUR 30 (A) 10/14/2018 2105   NITRITE POSITIVE (A) 10/14/2018 2105   LEUKOCYTESUR LARGE (A) 10/14/2018 2105   Sepsis Labs: @LABRCNTIP (procalcitonin:4,lacticacidven:4)  ) Recent Results  (from the past 240 hour(s))  SARS Coronavirus 2 [facility] order, Performed in [facility] hospital lab) Nasopharyngeal Nasopharyngeal Swab     Status: None   Collection Time: 10/14/18  6:55 PM   Specimen: Nasopharyngeal Swab  Result Value Ref Range Status   SARS Coronavirus 2 NEGATIVE NEGATIVE Final    Comment: (NOTE) If result is NEGATIVE SARS-CoV-2 target nucleic acids are NOT DETECTED. The SARS-CoV-2 RNA is generally detectable in upper and lower  respiratory specimens during the acute phase of infection. The lowest  concentration of SARS-CoV-2 viral copies this assay can detect is 250  copies / mL. A negative result does not preclude SARS-CoV-2 infection  and should not be used as the sole basis for treatment or other  patient management decisions.  A negative result may occur with  improper specimen collection / handling, submission of specimen other  than nasopharyngeal swab, presence of viral mutation(s) within the  areas targeted by this assay, and inadequate number of viral copies  (<250 copies / mL). A negative result must be combined with clinical  observations, patient history, and epidemiological information. If result is POSITIVE SARS-CoV-2 target nucleic acids are DETECTED. The SARS-CoV-2 RNA is generally detectable in upper and lower  respiratory specimens dur ing the acute phase of infection.  Positive  results are indicative of active infection with SARS-CoV-2.  Clinical  correlation with patient history and other diagnostic information is  necessary to determine patient infection status.  Positive results do  not rule out bacterial infection or co-infection with other viruses. If result is PRESUMPTIVE POSTIVE SARS-CoV-2 nucleic acids MAY BE PRESENT.   A presumptive positive result was obtained on the submitted specimen  and confirmed on repeat testing.  While 2019 novel coronavirus  (SARS-CoV-2) nucleic acids may be present in the submitted sample  additional  confirmatory testing may be necessary for epidemiological  and / or clinical management purposes  to differentiate between  SARS-CoV-2 and other Sarbecovirus currently known to infect humans.  If clinically indicated additional testing with an alternate test  methodology [phone]([id number]) is advised. The SARS-CoV-2 RNA is generally  detectable in upper and lower respiratory sp ecimens during the acute  phase of infection. The expected result is Negative. Fact Sheet for Patients:  BoilerBrush.com.cyhttps://www.fda.gov/media/136312/download Fact Sheet for Healthcare Providers: https://pope.com/https://www.fda.gov/media/136313/download This test is not yet approved or cleared by the [place] FDA and has been authorized for detection and/or diagnosis of SARS-CoV-2 by FDA under an Emergency Use Authorization (EUA).  This EUA will remain in effect (meaning this test can be used) for the duration of the COVID-19 declaration under Section 564(b)(1) of the Act, 21 U.S.C. section 360bbb-3(b)(1), unless the authorization is terminated or revoked sooner. Performed at [facility], [address] [place], [place] [address]   Blood Culture (routine x 2)     Status: None (Preliminary result)   Collection Time: 10/14/18  7:50 PM   Specimen: BLOOD  Result Value Ref Range Status   Specimen Description BLOOD RIGHT NECK  Final   Special Requests   Final    BOTTLES DRAWN AEROBIC AND ANAEROBIC Blood Culture results may not be optimal due to an inadequate volume of blood received in culture bottles   Culture   Final    NO GROWTH 4 DAYS Performed at [facility], [address] [place], [place] [address]    Report Status PENDING  Incomplete  Urine culture     Status: Abnormal   Collection Time: 10/14/18  9:05 PM   Specimen: In/Out Cath Urine  Result Value Ref Range Status   Specimen Description IN/OUT CATH URINE  Final   Special Requests   Final    NONE Performed at [facility], [address] [place], [place]  [address]    Culture >=100,000 COLONIES/mL PROTEUS MIRABILIS (A)  Final   Report Status 10/17/2018 FINAL  Final   Organism ID, Bacteria PROTEUS MIRABILIS (A)  Final      Susceptibility   Proteus mirabilis - MIC*    AMPICILLIN <=2 SENSITIVE Sensitive     CEFAZOLIN <=4 SENSITIVE Sensitive     CEFTRIAXONE <=1 SENSITIVE Sensitive     CIPROFLOXACIN <=0.25 SENSITIVE Sensitive     GENTAMICIN <=1 SENSITIVE Sensitive     IMIPENEM 2 SENSITIVE Sensitive  NITROFURANTOIN 256 RESISTANT Resistant     TRIMETH/SULFA <=20 SENSITIVE Sensitive     AMPICILLIN/SULBACTAM <=2 SENSITIVE Sensitive     PIP/TAZO <=4 SENSITIVE Sensitive     * >=100,000 COLONIES/mL PROTEUS MIRABILIS      Radiology Studies: No results found.      Scheduled Meds:  allopurinol  300 mg Oral q morning - 10a   amLODipine  2.5 mg Oral q morning - 10a   hydrALAZINE  50 mg Oral BID   losartan  25 mg Oral q morning - 10a   magnesium oxide  400 mg Oral Daily   metoprolol succinate  25 mg Oral q morning - 10a   pantoprazole  40 mg Oral Daily   Continuous Infusions:  sodium chloride 10 mL/hr at 10/17/18 0400     LOS: 4 days    Time spent: 35 minutes   Noe Gens, Ramos    [facility] 10/19/2018, 9:13 AM     Please page through [place]:  [email or web address] Password [email or web address] If 7PM-7AM, please contact night-coverage      Attending MD Note:  I have seen and examined the patient with nurse practitioner/physician assistant and agree with the note above which has been edited to reflect our agreed upon history, exam, and assessment/plan.   I have personally reviewed the orders for the patient, which were made under my direction.    Waylon Hershey. is a 84 y.o. male with dementia, dCHF, history stroke who presents with acute encephalopathy.  He is improving after fluids and antibiotics.  We will evaluate for rehab placement.    [place]

## 2018-10-20 NOTE — Progress Notes (Signed)
Palliative Medicine RN Note: Symptom check. Patient is awake and answering some questions, mostly yes/no. Discussed with PMT members. Patient does not appear to be appropriate for residential hospice, though this could change rapidly. Recommend outpatient hospice at home or SNF with plan for residential hospice once he stops eating and becomes non-verbal.  Marjie Skiff. Nabilah Davoli, RN, BSN, [facility] Palliative Medicine Team 10/20/2018 10:39 AM Office [phone]

## 2018-10-20 NOTE — Progress Notes (Addendum)
PROGRESS NOTE    Bjorn LoserFoster Truett Jr.  ZOX:[id number] DOB: 26-Oct-1934 DOA: 10/14/2018 PCP: Zoila ShutterWoodyear, Wynne E, MD   Brief Narrative:  [name] is an 84 y.o. M with advanced dementia home dwelling, dCHF, hemorrhagic stroke, pAF not on AC due to fall risk, HTN, eosinophilic esophagitis, DM and CAD who presented with  Decreased sensorium/confusion and possible left facial droop and LUE neglect.    In the ER, noted to have temp 100.71F, UA concerning for UTI.        Assessment & Plan:  Acute Metabolic Encephalopathy due to UTI, dehydration -Treated for UTI, urine culture grew proteus.  -There was some diagnostic ambiguity about whether he had a stroke, MRI/MRI was negative for stroke and unremarkable other than generalized atrophy. P: Continue seroquel 50 mg QHS   Proteus Mirabilis UTI  -UA with large leukocytes, nitrite positive, many bacteria, >50 WBC -UC with > 100k proteus, S- ceftriaxone.  Completed abx.  P: No further acute interventions per family    Acute on chronic Dysphagia  Ongoing for at least 3 years, hx of PEG, since removed P: Comfort feeding as tolerated    HTN Stroke secondary prevention SBP 140-170 P: Continue norvasc, hydralazine, losartan, toprol  Lasix  Pravastatin  Gout No active disease P: Continue allopurinol   Disposition Patient is DNR.  Family requested no aggressive interventions in terms of abx/IVF etc. He does not meet criteria for inpatient hospice home - he continues to be verbal and taking in ~ 30% of diet.  Currently under review for possible SNF placement.  Will plan for repeat COVID testing to be done on 10/11.   DVT prophylaxis: SCD's  Code Status: DNR  Family Communication: Reviewed plan of care with daughter Arlana Hoveamica 10/9.  She indicates her sister Dois DavenportSandra [phone]) & brother Gerlene BurdockRichard are his POA.  Reviewed that he is not a candidate for residential hospice at this time. Dois DavenportSandra indicates that she has filed for guardian ad  litem & a competency hearing is pending.  Medical decision making The below labs and imaging reports reviewed and summarized above.  Medication management as above.  The patient was admitted for acute stroke, he remains debilitated and weak, unable to walk, or participate in self-cares which is his normal baseline.  We are optimistic that he will be able to rehabilitate, and transfer back to home.    Consultants:   Palliative Care   Procedures:     Antimicrobials:   Cefepime 10/3 >> 10/4   Vanco 10/3 >> 10/4   Ceftriaxone 10/4 >> 10/6     Subjective: Pt denies acute complaints. No issues overnight.  Afebrile.   Objective: Vitals:   10/19/18 0741 10/19/18 1128 10/19/18 1943 10/20/18 0752  BP: (!) 175/96 (!) 149/80 (!) 163/90 (!) 159/83  Pulse: 67 64 62 (!) 56  Resp: 16 16 18 16   Temp: 98.3 F (36.8 C) (!) 97.5 F (36.4 C) 97.9 F (36.6 C) 98.6 F (37 C)  TempSrc: Axillary Oral Oral Oral  SpO2: 100% 99% 99% 100%    Intake/Output Summary (Last 24 hours) at 10/20/2018 1617 Last data filed at 10/20/2018 1024 Gross per 24 hour  Intake 265 ml  Output 250 ml  Net 15 ml   There were no vitals filed for this visit.  Examination: General appearance: elderly gentleman lying in bed in NAD HEENT: MM pink/moist, no jvd Skin:  Warm/dry, no edema  Cardiac:  s1s2 rrr, SEM 4th ICS LSB Respiratory: non-labored on room air, lungs bilaterally  clear without wheeze Abdomen: obese/soft, bsx4 active, tolerating PO's  MSK: no acute deformities  Neuro:  Awake, alert, oriented to self / location, unable to state current year (1968), generalized weakness but moves all extremities  Psych:  Calm      Data Reviewed: I have personally reviewed following labs and imaging studies:  CBC: Recent Labs  Lab 10/14/18 1737 10/14/18 1739 10/15/18 0243  WBC 9.0  --  10.3  NEUTROABS 7.2  --   --   HGB 10.8* 12.2* 10.0*  HCT 32.6* 36.0* 31.2*  MCV 93.1  --  93.4  PLT 320  --  276    Basic Metabolic Panel: Recent Labs  Lab 10/14/18 1737 10/14/18 1739 10/15/18 0243  NA 143 142 142  K 4.1 4.0 3.9  CL 105 105 104  CO2 27  --  27  GLUCOSE 131* 122* 114*  BUN CREATININE 0.96 0.80 0.83  CALCIUM 9.2  --  8.8*   GFR: CrCl cannot be calculated (Unknown ideal weight.). Liver Function Tests: Recent Labs  Lab 10/14/18 1737 10/15/18 0243  AST 18 14*  ALT 12 7  ALKPHOS 50 51  BILITOT 0.7 0.4  PROT 7.3 6.5  ALBUMIN 3.2* 2.6*   No results for input(s): LIPASE, AMYLASE in the last 168 hours. Recent Labs  Lab 10/14/18 1848  AMMONIA 53*   Coagulation Profile: Recent Labs  Lab 10/14/18 1737  INR 1.0   Cardiac Enzymes: No results for input(s): CKTOTAL, CKMB, CKMBINDEX, TROPONINI in the last 168 hours.   BNP (last 3 results) No results for input(s): PROBNP in the last 8760 hours.   HbA1C: No results for input(s): HGBA1C in the last 72 hours.   CBG: Recent Labs  Lab 10/17/18 0018 10/17/18 0501 10/17/18 1227 10/17/18 1651 10/18/18 1635  GLUCAP 118* 135* 53* 95 144*   Lipid Profile: No results for input(s): CHOL, HDL, LDLCALC, TRIG, CHOLHDL, LDLDIRECT in the last 72 hours.   Thyroid Function Tests: No results for input(s): TSH, T4TOTAL, FREET4, T3FREE, THYROIDAB in the last 72 hours.   Anemia Panel: No results for input(s): VITAMINB12, FOLATE, FERRITIN, TIBC, IRON, RETICCTPCT in the last 72 hours.   Urine analysis:    Component Value Date/Time   COLORURINE YELLOW 10/14/2018 2105   APPEARANCEUR CLOUDY (A) 10/14/2018 2105   LABSPEC 1.016 10/14/2018 2105   PHURINE 8.0 10/14/2018 2105   GLUCOSEU NEGATIVE 10/14/2018 2105   HGBUR NEGATIVE 10/14/2018 2105   BILIRUBINUR NEGATIVE 10/14/2018 2105   KETONESUR NEGATIVE 10/14/2018 2105   PROTEINUR 30 (A) 10/14/2018 2105   NITRITE POSITIVE (A) 10/14/2018 2105   LEUKOCYTESUR LARGE (A) 10/14/2018 2105   Sepsis Labs: (procalcitonin:4,lacticacidven:4)  ) Recent Results (from  the past 240 hour(s))  SARS Coronavirus 2 [facility] order, Performed in [facility] hospital lab) Nasopharyngeal Nasopharyngeal Swab     Status: None   Collection Time: 10/14/18  6:55 PM   Specimen: Nasopharyngeal Swab  Result Value Ref Range Status   SARS Coronavirus 2 NEGATIVE NEGATIVE Final    Comment: (NOTE) If result is NEGATIVE SARS-CoV-2 target nucleic acids are NOT DETECTED. The SARS-CoV-2 RNA is generally detectable in upper and lower  respiratory specimens during the acute phase of infection. The lowest  concentration of SARS-CoV-2 viral copies this assay can detect is 250  copies / mL. A negative result does not preclude SARS-CoV-2 infection  and should not be used as the sole basis for treatment or other  patient management decisions.  A negative  result may occur with  improper specimen collection / handling, submission of specimen other  than nasopharyngeal swab, presence of viral mutation(s) within the  areas targeted by this assay, and inadequate number of viral copies  (<250 copies / mL). A negative result must be combined with clinical  observations, patient history, and epidemiological information. If result is POSITIVE SARS-CoV-2 target nucleic acids are DETECTED. The SARS-CoV-2 RNA is generally detectable in upper and lower  respiratory specimens dur ing the acute phase of infection.  Positive  results are indicative of active infection with SARS-CoV-2.  Clinical  correlation with patient history and other diagnostic information is  necessary to determine patient infection status.  Positive results do  not rule out bacterial infection or co-infection with other viruses. If result is PRESUMPTIVE POSTIVE SARS-CoV-2 nucleic acids MAY BE PRESENT.   A presumptive positive result was obtained on the submitted specimen  and confirmed on repeat testing.  While 2019 novel coronavirus  (SARS-CoV-2) nucleic acids may be present in the submitted sample  additional  confirmatory testing may be necessary for epidemiological  and / or clinical management purposes  to differentiate between  SARS-CoV-2 and other Sarbecovirus currently known to infect humans.  If clinically indicated additional testing with an alternate test  methodology [phone]) is advised. The SARS-CoV-2 RNA is generally  detectable in upper and lower respiratory sp ecimens during the acute  phase of infection. The expected result is Negative. Fact Sheet for Patients:  StrictlyIdeas.no Fact Sheet for Healthcare Providers: BankingDealers.co.za This test is not yet approved or cleared by the [place] FDA and has been authorized for detection and/or diagnosis of SARS-CoV-2 by FDA under an Emergency Use Authorization (EUA).  This EUA will remain in effect (meaning this test can be used) for the duration of the COVID-19 declaration under Section 564(b)(1) of the Act, 21 U.S.C. section 360bbb-3(b)(1), unless the authorization is terminated or revoked sooner. Performed at [facility], [place] [address] [place], [place] [address]   Blood Culture (routine x 2)     Status: None   Collection Time: 10/14/18  7:50 PM   Specimen: BLOOD  Result Value Ref Range Status   Specimen Description BLOOD RIGHT NECK  Final   Special Requests   Final    BOTTLES DRAWN AEROBIC AND ANAEROBIC Blood Culture results may not be optimal due to an inadequate volume of blood received in culture bottles   Culture   Final    NO GROWTH 5 DAYS Performed at [facility], [place] [address], [place], [place] [address]    Report Status 10/19/2018 FINAL  Final  Urine culture     Status: Abnormal   Collection Time: 10/14/18  9:05 PM   Specimen: In/Out Cath Urine  Result Value Ref Range Status   Specimen Description IN/OUT CATH URINE  Final   Special Requests   Final    NONE Performed at [facility], [place] [address] [place], [place] [address]     Culture >=100,000 COLONIES/mL PROTEUS MIRABILIS (A)  Final   Report Status 10/17/2018 FINAL  Final   Organism ID, Bacteria PROTEUS MIRABILIS (A)  Final      Susceptibility   Proteus mirabilis - MIC*    AMPICILLIN <=2 SENSITIVE Sensitive     CEFAZOLIN <=4 SENSITIVE Sensitive     CEFTRIAXONE <=1 SENSITIVE Sensitive     CIPROFLOXACIN <=0.25 SENSITIVE Sensitive     GENTAMICIN <=1 SENSITIVE Sensitive     IMIPENEM 2 SENSITIVE  Sensitive     NITROFURANTOIN 256 RESISTANT Resistant     TRIMETH/SULFA <=20 SENSITIVE Sensitive     AMPICILLIN/SULBACTAM <=2 SENSITIVE Sensitive     PIP/TAZO <=4 SENSITIVE Sensitive     * >=100,000 COLONIES/mL PROTEUS MIRABILIS      Radiology Studies: No results found.      Scheduled Meds:  allopurinol  300 mg Oral q morning - 10a   amLODipine  2.5 mg Oral q morning - 10a   hydrALAZINE  50 mg Oral BID   losartan  25 mg Oral q morning - 10a   magnesium oxide  400 mg Oral Daily   metoprolol succinate  25 mg Oral q morning - 10a   pantoprazole  40 mg Oral Daily   Continuous Infusions:  sodium chloride 10 mL/hr at 10/17/18 0400     LOS: 5 days    Time spent: 35 minutes   Canary Brim, AG-ACNP-[facility]    [facility] 10/20/2018, 4:17 PM     Please page through [place]:  [email or web address] Password [email or web address] If 7PM-7AM, please contact night-coverage     Attending MD Note:  I have seen and examined the patient with nurse practitioner/physician assistant and agree with the note above which has been edited to reflect our agreed upon history, exam, and assessment/plan.   I have personally reviewed the orders for the patient, which were made under my direction.     Jaxen Samples P Saphyra Hutt

## 2018-10-20 NOTE — Care Management Important Message (Signed)
Important Message  Patient Details  Name: Mason Webb. MRN: [id number] Date of Birth: 1934-10-06   Medicare Important Message Given:  Yes     [name] 10/20/2018, 2:48 PM

## 2018-10-20 NOTE — [place] FL2 (Signed)
[place] MEDICAID FL2 LEVEL OF CARE SCREENING TOOL     IDENTIFICATION  Patient Name: Mason Webb. Birthdate: Dec 04, 1934 Sex: male Admission Date (Current Location): 10/14/2018  [facility] and [place] Number:  Producer, television/film/video and Address:  [facility], [address] [place], [place] [address]      Provider Number: [id number]  Attending Physician Name and Address:  Alberteen Sam, *  Relative Name and Phone Number:       Current Level of Care: Hospital Recommended Level of Care: Skilled Nursing Facility Prior Approval Number:    Date Approved/Denied:   PASRR Number: [id number]  Discharge Plan: SNF    Current Diagnoses: Patient Active Problem List   Diagnosis Date Noted  . Adult failure to thrive   . DNR (do not resuscitate)   . Palliative care by specialist   . Dysphagia   . Encephalopathy acute 10/15/2018  . Acute lower UTI 10/14/2018  . Altered mental status 10/14/2018  . Hypertension 10/14/2018  . Hyperlipidemia 10/14/2018    Orientation RESPIRATION BLADDER Height & Weight     Self  Normal Incontinent Weight:   Height:     BEHAVIORAL SYMPTOMS/MOOD NEUROLOGICAL BOWEL NUTRITION STATUS      Continent Diet(mechanical soft)  AMBULATORY STATUS COMMUNICATION OF NEEDS Skin   Extensive Assist Verbally Normal                       Personal Care Assistance Level of Assistance  Bathing, Feeding, Dressing Bathing Assistance: Maximum assistance Feeding assistance: Maximum assistance Dressing Assistance: Maximum assistance     Functional Limitations Info  Sight, Speech, Hearing Sight Info: Adequate Hearing Info: Adequate Speech Info: Adequate    SPECIAL CARE FACTORS FREQUENCY  PT (By licensed PT), OT (By licensed OT)     PT Frequency: 5x/wk OT Frequency: 5x/wk            Contractures Contractures Info: Not present    Additional Factors Info  Code Status, Allergies Code Status Info: DNR Allergies  Info: NKA           Current Medications (10/20/2018):  This is the current hospital active medication list Current Facility-Administered Medications  Medication Dose Route Frequency Provider Last Rate Last Dose  . 0.9 %  sodium chloride infusion   Intravenous PRN Calvert Cantor, MD 10 mL/hr at 10/17/18 0400    . acetaminophen (TYLENOL) tablet 650 mg  650 mg Oral Q4H PRN Pearson Grippe, MD       Or  . acetaminophen (TYLENOL) solution 650 mg  650 mg Per Tube Q4H PRN Pearson Grippe, MD       Or  . acetaminophen (TYLENOL) suppository 650 mg  650 mg Rectal Q4H PRN Pearson Grippe, MD   650 mg at 10/17/18 2008  . allopurinol (ZYLOPRIM) tablet 300 mg  300 mg Oral q morning - 10a Pearson Grippe, MD   300 mg at 10/20/18 8786  . amLODipine (NORVASC) tablet 2.5 mg  2.5 mg Oral q morning - 10a Pearson Grippe, MD   2.5 mg at 10/20/18 7672  . hydrALAZINE (APRESOLINE) injection 10 mg  10 mg Intravenous Q6H PRN Pearson Grippe, MD   10 mg at 10/17/18 1953  . hydrALAZINE (APRESOLINE) tablet 50 mg  50 mg Oral BID Pearson Grippe, MD   50 mg at 10/20/18 0947  . LORazepam (ATIVAN) tablet 1 mg  1 mg Oral Q4H PRN Canary Brim, NP      .  losartan (COZAAR) tablet 25 mg  25 mg Oral q morning - 10a Jani Gravel, MD   25 mg at 10/20/18 0768  . magnesium oxide (MAG-OX) tablet 400 mg  400 mg Oral Daily Debbe Odea, MD   400 mg at 10/20/18 0923  . metoprolol succinate (TOPROL-XL) 24 hr tablet 25 mg  25 mg Oral q morning - 10a Jani Gravel, MD   25 mg at 10/20/18 0881  . metoprolol tartrate (LOPRESSOR) injection 5 mg  5 mg Intravenous Q6H PRN Blount, Scarlette Shorts T, NP      . morphine CONCENTRATE 10 MG/0.5ML oral solution 5 mg  5 mg Oral Q1H PRN Knox Royalty, NP      . pantoprazole (PROTONIX) EC tablet 40 mg  40 mg Oral Daily Jani Gravel, MD   40 mg at 10/20/18 0923  . QUEtiapine (SEROQUEL) tablet 50 mg  50 mg Oral QHS PRN Jani Gravel, MD         Discharge Medications: Please see discharge summary for a list of discharge medications.  Relevant Imaging  Results:  Relevant Lab Results:   Additional Information SS#: [id number]  Geralynn Ochs, LCSW

## 2018-10-21 NOTE — Progress Notes (Signed)
CSW received call from [place] at [facility] to discuss patient's case and plan of care. Mason Webb reports that DSS is pursuing guardianship over the patient and his wife as the current POAs Mason Webb and Mason Webb are not providing the care that the patient and his wife need. Mason Webb and Mason Webb still continue to share POA over the patient and wife, but patient should be getting served papers while he is in the hospital that DSS is pursuing guardianship. Per Mason Webb, the patient and wife have needed placement for quite some time due to advanced dementia in both of them and the children are not providing care for them in the home; it is an unsafe situation for them at home. As long as patient's POAs are on board with SNF placement vs hospice placement (depending on outcome), then the POAs can continue with decision-making. DSS needs to know if the family changes their mind and decides to allow the patient to return home.   CSW completed FL2 and faxed out. Will complete assessment with family to discuss SNF placement if patient does not meet criteria for residential hospice.   Mason Webb, [place] Clinical Social Worker [phone]

## 2018-10-21 NOTE — TOC Initial Note (Signed)
Transition of Care (TOC) - Initial/Assessment Note    Patient Details  Name: Mason Webb. MRN: [id number] Date of Birth: [birth year]  Transition of Care [facility]) CM/SW Contact:    [name], LCSW Phone Number: 10/21/2018, 12:05 PM  Clinical Narrative:   Please see CSW note prior, discussing guardianship and DSS. At this time pt's son and daughter Mason Webb, still have POA. Pt's son  Mason Webb is onboard with SNF. Pt's son states they choose for pt to go to [facility] as she has been there before and they have the best rateing out of the offers. CSW to follow up with facility.           Expected Discharge Plan: Skilled Nursing Facility Barriers to Discharge: Ship broker, Continued Medical Work up s  Patient Goals and CMS Choice   CMS Medicare.gov Compare Post Acute Care list provided to:: Patient Represenative (must comment)(pt's son, POA) Choice offered to / list presented to : Adult Children(via telephone)  Expected Discharge Plan and Services Expected Discharge Plan: [place] In-house Referral: Clinical Social Work   Post Acute Care Choice: [place] Living arrangements for the past 2 months: [place]: NA          Prior Living Arrangements/Services Living arrangements for the past 2 months: Single Family Home Lives with:: Self Patient language and need for interpreter reviewed:: Yes Do you feel safe going back to the place where you live?: Yes      Need for Family Participation in Patient Care: Yes (Comment) Care giver support system in place?: Yes (comment)   Criminal Activity/Legal Involvement Pertinent to Current Situation/Hospitalization: No - Comment as needed  Activities of Daily Living      Permission Sought/Granted Permission sought to share information with : Family Supports, Chartered certified accountant granted to share information with :  Yes, Verbal Permission Granted  Share Information with NAME: Mason Webb  Permission granted to share info w AGENCY: Database administrator granted to share info w Relationship: Son     Emotional Assessment Appearance:: Appears stated age Attitude/Demeanor/Rapport: Unable to Assess Affect (typically observed): Unable to Assess Orientation: : Oriented to Self Alcohol / Substance Use: Not Applicable Psych Involvement: No (comment)  Admission diagnosis:  Acute cystitis without hematuria [N30.00] Altered mental status, unspecified altered mental status type [R41.82] Encephalopathy acute [G93.40] Patient Active Problem List   Diagnosis Date Noted  . Adult failure to thrive   . DNR (do not resuscitate)   . Palliative care by specialist   . Dysphagia   . Encephalopathy acute 10/15/2018  . Acute lower UTI 10/14/2018  . Altered mental status 10/14/2018  . Hypertension 10/14/2018  . Hyperlipidemia 10/14/2018   PCP:  Burman Freestone, MD Pharmacy:   [id number] [place], [place] [address] AT [address] [place] [address] Phone: [phone] Fax: [phone]     Social Determinants of Health (SDOH) Interventions    Readmission Risk Interventions No flowsheet data found.

## 2018-10-21 NOTE — Progress Notes (Addendum)
PROGRESS NOTE    Mason LoserFoster Vendetti Jr.  ZOX:[id number] DOB: 26-May-1934 DOA: 10/14/2018 PCP: Zoila ShutterWoodyear, Wynne E, MD   Brief Narrative:  Mason Webb is an 84 y.o. M with advanced dementia home dwelling, dCHF, hemorrhagic stroke, pAF not on AC due to fall risk, HTN, eosinophilic esophagitis, DM and CAD who presented with  Decreased sensorium/confusion and possible left facial droop and LUE neglect.    In the ER, noted to have temp 100.22F, UA concerning for UTI.    Assessment & Plan:  Acute Metabolic Encephalopathy due to UTI, dehydration Treated for UTI, urine culture grew proteus. There was some diagnostic ambiguity about whether he had a stroke, MRI/MRI was negative for stroke and unremarkable other than generalized atrophy. -Supportive care, PT efforts -Seroquel 50 mg QHS  Proteus Mirabilis UTI  UA with large leukocytes, nitrite positive, many bacteria, >50 WBC.  UC with > 100k proteus, S- ceftriaxone.  Completed abx.  -no further interventions per family   Acute on chronic Dysphagia  Ongoing for at least 3 years, hx of PEG, since removed -comfort feeding as tolerated    -change to mechanical soft diet as he is edentulous   HTN Stroke secondary prevention BP within acceptable range -continue norvasc, hydralazine, losartan, toprol  Gout No active disease -continue allopurinol   Disposition Patient is DNR.  Two children live in [place], one daughter in [place].  The daughter in [place] is the POA.  She indicates she has filed for guardian ad litem with the state of [place].  She is not local and states she feels the family here can not care for her parents.  Under review for possible SNF placement [facility] He is not a candidate for residential hospice at this time.   DVT prophylaxis: SCD's  Code Status: DNR  Family Communication: Reviewed plan of care with daughter Arlana Hoveamica 10/9.  She indicates her sister Dois DavenportSandra [phone]) & brother Gerlene BurdockRichard are his POA.     Medical decision making  The below labs and imaging reports reviewed and summarized above.  Medication management as above. ----    Consultants:   Palliative Care   Procedures:     Antimicrobials:   Cefepime 10/3 >> 10/4   Vanco 10/3 >> 10/4   Ceftriaxone 10/4 >> 10/6     Subjective: Pt denies acute complaints. RN reports he had difficulty chewing breakfast but ate approximately 50% of meal tray. VSS / afebrile.   Objective: Vitals:   10/19/18 1943 10/20/18 0752 10/20/18 2348 10/21/18 0730  BP: (!) 163/90 (!) 159/83 (!) 173/86 (!) 177/90  Pulse: 62 (!) 56 (!) 55 (!) 54  Resp: 18 16 17 13   Temp: 97.9 F (36.6 C) 98.6 F (37 C) 97.8 F (36.6 C) (!) 97.5 F (36.4 C)  TempSrc: Oral Oral Oral Oral  SpO2: 99% 100% 99% 100%    Intake/Output Summary (Last 24 hours) at 10/21/2018 0753 Last data filed at 10/20/2018 1024 Gross per 24 hour  Intake -  Output 250 ml  Net -250 ml   There were no vitals filed for this visit.  Examination: General appearance: elderly gentleman lying in bed in NAD HEENT: MM pink/moist, no jvd, edentulous   Skin:  Warm/dry, no edema  Cardiac:  s1s2 rrr, 2/6 SEM LSB 4th ICS Respiratory: even/non-labored on room air, lungs bilaterally clear  Abdomen: obese/soft, bsx4 active, tolerating PO's MSK: no acute deformities  Neuro: awake, alert, oriented to self, location (hospital but not city), follows commands, pleasant Psych: calm  Data Reviewed: I have personally reviewed following labs and imaging studies:  CBC: Recent Labs  Lab 10/14/18 1737 10/14/18 1739 10/15/18 0243  WBC 9.0  --  10.3  NEUTROABS 7.2  --   --   HGB 10.8* 12.2* 10.0*  HCT 32.6* 36.0* 31.2*  MCV 93.1  --  93.4  PLT 320  --  276   Basic Metabolic Panel: Recent Labs  Lab 10/14/18 1737 10/14/18 1739 10/15/18 0243  NA 143 142 142  K 4.1 4.0 3.9  CL 105 105 104  CO2 27  --  27  GLUCOSE 131* 122* 114*  BUN 15 20 14   CREATININE 0.96 0.80 0.83  CALCIUM 9.2  --  8.8*   GFR: CrCl  cannot be calculated (Unknown ideal weight.). Liver Function Tests: Recent Labs  Lab 10/14/18 1737 10/15/18 0243  AST 18 14*  ALT 12 7  ALKPHOS 50 51  BILITOT 0.7 0.4  PROT 7.3 6.5  ALBUMIN 3.2* 2.6*   No results for input(s): LIPASE, AMYLASE in the last 168 hours. Recent Labs  Lab 10/14/18 1848  AMMONIA 53*   Coagulation Profile: Recent Labs  Lab 10/14/18 1737  INR 1.0   Cardiac Enzymes: No results for input(s): CKTOTAL, CKMB, CKMBINDEX, TROPONINI in the last 168 hours.   BNP (last 3 results) No results for input(s): PROBNP in the last 8760 hours.   HbA1C: No results for input(s): HGBA1C in the last 72 hours.   CBG: Recent Labs  Lab 10/17/18 0018 10/17/18 0501 10/17/18 1227 10/17/18 1651 10/18/18 1635  GLUCAP 118* 135* 53* 95 144*   Lipid Profile: No results for input(s): CHOL, HDL, LDLCALC, TRIG, CHOLHDL, LDLDIRECT in the last 72 hours.   Thyroid Function Tests: No results for input(s): TSH, T4TOTAL, FREET4, T3FREE, THYROIDAB in the last 72 hours.   Anemia Panel: No results for input(s): VITAMINB12, FOLATE, FERRITIN, TIBC, IRON, RETICCTPCT in the last 72 hours.   Urine analysis:    Component Value Date/Time   COLORURINE YELLOW 10/14/2018 2105   APPEARANCEUR CLOUDY (A) 10/14/2018 2105   LABSPEC 1.016 10/14/2018 2105   PHURINE 8.0 10/14/2018 2105   GLUCOSEU NEGATIVE 10/14/2018 2105   HGBUR NEGATIVE 10/14/2018 2105   BILIRUBINUR NEGATIVE 10/14/2018 2105   KETONESUR NEGATIVE 10/14/2018 2105   PROTEINUR 30 (A) 10/14/2018 2105   NITRITE POSITIVE (A) 10/14/2018 2105   LEUKOCYTESUR LARGE (A) 10/14/2018 2105   Sepsis Labs: @LABRCNTIP (procalcitonin:4,lacticacidven:4)  ) Recent Results (from the past 240 hour(s))  SARS Coronavirus 2 [facility] order, Performed in [facility] hospital lab) Nasopharyngeal Nasopharyngeal Swab     Status: None   Collection Time: 10/14/18  6:55 PM   Specimen: Nasopharyngeal Swab  Result Value Ref Range Status   SARS  Coronavirus 2 NEGATIVE NEGATIVE Final    Comment: (NOTE) If result is NEGATIVE SARS-CoV-2 target nucleic acids are NOT DETECTED. The SARS-CoV-2 RNA is generally detectable in upper and lower  respiratory specimens during the acute phase of infection. The lowest  concentration of SARS-CoV-2 viral copies this assay can detect is 250  copies / mL. A negative result does not preclude SARS-CoV-2 infection  and should not be used as the sole basis for treatment or other  patient management decisions.  A negative result may occur with  improper specimen collection / handling, submission of specimen other  than nasopharyngeal swab, presence of viral mutation(s) within the  areas targeted by this assay, and inadequate number of viral copies  (<250 copies / mL). A negative result must  be combined with clinical  observations, patient history, and epidemiological information. If result is POSITIVE SARS-CoV-2 target nucleic acids are DETECTED. The SARS-CoV-2 RNA is generally detectable in upper and lower  respiratory specimens dur ing the acute phase of infection.  Positive  results are indicative of active infection with SARS-CoV-2.  Clinical  correlation with patient history and other diagnostic information is  necessary to determine patient infection status.  Positive results do  not rule out bacterial infection or co-infection with other viruses. If result is PRESUMPTIVE POSTIVE SARS-CoV-2 nucleic acids MAY BE PRESENT.   A presumptive positive result was obtained on the submitted specimen  and confirmed on repeat testing.  While 2019 novel coronavirus  (SARS-CoV-2) nucleic acids may be present in the submitted sample  additional confirmatory testing may be necessary for epidemiological  and / or clinical management purposes  to differentiate between  SARS-CoV-2 and other Sarbecovirus currently known to infect humans.  If clinically indicated additional testing with an alternate test   methodology [phone]) is advised. The SARS-CoV-2 RNA is generally  detectable in upper and lower respiratory sp ecimens during the acute  phase of infection. The expected result is Negative. Fact Sheet for Patients:  BoilerBrush.com.cy Fact Sheet for Healthcare Providers: https://pope.com/ This test is not yet approved or cleared by the [place] FDA and has been authorized for detection and/or diagnosis of SARS-CoV-2 by FDA under an Emergency Use Authorization (EUA).  This EUA will remain in effect (meaning this test can be used) for the duration of the COVID-19 declaration under Section 564(b)(1) of the Act, 21 U.S.C. section 360bbb-3(b)(1), unless the authorization is terminated or revoked sooner. Performed at [facility], [address], [place], [place] [address]   Blood Culture (routine x 2)     Status: None   Collection Time: 10/14/18  7:50 PM   Specimen: BLOOD  Result Value Ref Range Status   Specimen Description BLOOD RIGHT NECK  Final   Special Requests   Final    BOTTLES DRAWN AEROBIC AND ANAEROBIC Blood Culture results may not be optimal due to an inadequate volume of blood received in culture bottles   Culture   Final    NO GROWTH 5 DAYS Performed at [facility], [address], [place], [place] [address]    Report Status 10/19/2018 FINAL  Final  Urine culture     Status: Abnormal   Collection Time: 10/14/18  9:05 PM   Specimen: In/Out Cath Urine  Result Value Ref Range Status   Specimen Description IN/OUT CATH URINE  Final   Special Requests   Final    NONE Performed at [facility], [address] [place], [place] [address]    Culture >=100,000 COLONIES/mL PROTEUS MIRABILIS (A)  Final   Report Status 10/17/2018 FINAL  Final   Organism ID, Bacteria PROTEUS MIRABILIS (A)  Final      Susceptibility   Proteus mirabilis - MIC*    AMPICILLIN <=2 SENSITIVE Sensitive     CEFAZOLIN <=4  SENSITIVE Sensitive     CEFTRIAXONE <=1 SENSITIVE Sensitive     CIPROFLOXACIN <=0.25 SENSITIVE Sensitive     GENTAMICIN <=1 SENSITIVE Sensitive     IMIPENEM 2 SENSITIVE Sensitive     NITROFURANTOIN 256 RESISTANT Resistant     TRIMETH/SULFA <=20 SENSITIVE Sensitive     AMPICILLIN/SULBACTAM <=2 SENSITIVE Sensitive     PIP/TAZO <=4 SENSITIVE Sensitive     * >=100,000 COLONIES/mL PROTEUS MIRABILIS  Radiology Studies: No results found.      Scheduled Meds: . allopurinol  300 mg Oral q morning - 10a  . amLODipine  2.5 mg Oral q morning - 10a  . hydrALAZINE  50 mg Oral BID  . losartan  25 mg Oral q morning - 10a  . magnesium oxide  400 mg Oral Daily  . metoprolol succinate  25 mg Oral q morning - 10a  . pantoprazole  40 mg Oral Daily   Continuous Infusions: . sodium chloride 10 mL/hr at 10/17/18 0400     LOS: 6 days    Time spent: 25 minutes   Canary Brim, AG-ACNP-[facility]    [facility] 10/21/2018, 7:53 AM     Please page through AMION:  [email or web address] Password [email or web address] If 7PM-7AM, please contact night-coverage     Attending MD Note:  I have seen and examined the patient with nurse practitioner/physician assistant and agree with the note above which has been edited to reflect our agreed upon history, exam, and assessment/plan.   I have personally reviewed the orders for the patient, which were made under my direction.    Mason Wollman. is a 84 y.o. male with, ongoing, D CHF, hemorrhagic stroke, PAF not on anticoagulation due to fall risk, HTN, eosinophilic esophagitis, DM, and CAD who presented with decreased sensorium/confusion and possible left facial droop and left upper extremity neglect.  In the ER, he was found to have temperature 100.3 F, urinalysis concerning for UTI.  CT head unremarkable.    General appearance: Elderly adult male, alert and in no acute distress.   HEENT: Anicteric, conjunctiva pink, lids and lashes  normal. No nasal deformity, discharge, epistaxis.  Lips moist, edentulous. OP moist, no oral lesions.   Skin: Warm and dry.  No suspicious rashes or lesions. Cardiac: RRR, no murmurs appreciated.  No LE edema.    Respiratory: Normal respiratory rate and rhythm.  CTAB without rales or wheezes. Abdomen: Abdomen soft.  Abdomen soft without voluntary guarding. No ascites, distension, hepatosplenomegaly.   MSK: No deformities or effusions of the large joints of the upper or lower extremities bilaterally. Neuro: Awake and responsive to me.  Responses are slowed, he gives 1 word answers.  Memory seems severely impaired (RN is feeding him yogurt when we entered the room, when asking "what are you eating?",  He replies "pancakes") Moves both upper extremities slowly but with equal coordination and strength.  Speech fluent.     Psych: Sensorium intact and responding to questions, attention normal. Affect blunted.  Judgment and insight appear severely impaired.      Acute metabolic encephalopathy due to UTI/dehydration Patient admitted.  Initially some diagnostic ambiguity about whether he did have a stroke.  MRI brain negative for acute stroke.  Suspect his encephalopathy was multifactorial from UTI, dehydration in the setting of advanced dementia. This appears to be resolved to his cognitive baseline    Proteus mirabilis UTI Treated with ceftriaxone, completed course.  No further fever, urinary symptoms.  Chronic dysphagia Ongoing for the last 3 years.  He previously had a PEG tube, this is since been removed. We will transition to mechanical soft diet/pure as the patient appears to have much better oral intake with these foods Family understand aspiration risk, but given his previous experience with a PEG tube, they would prefer oral intake over enteral nutrition.  Hypertension Stroke secondary prevention Blood pressure control -Continue Norvasc, hydralazine, losartan, Toprol  Gout  -Continue allopurinol    MDM  Above labs and imaging reports reviewed and summarized above.  Medication management as above.  The patient was admitted with confusion in the setting of UTI superimposed on baseline dementia.  He has had a change in his functional status (previously able to participate in self-cares, feed self, and ambulate, currently not able to ambulate, or feed self).  He will need significant rehabilitation in order to cover to his prior level of function.    [place]

## 2018-10-22 NOTE — Progress Notes (Signed)
PROGRESS NOTE    Mason LoserFoster Kugelman Jr.  ZOX:[id number] DOB: 1934-06-23 DOA: 10/14/2018 PCP: Zoila ShutterWoodyear, Wynne E, MD   Brief Narrative:  Mason Webb is an 84 y.o. M with advanced dementia home dwelling, dCHF, hemorrhagic stroke, pAF not on AC due to fall risk, HTN, eosinophilic esophagitis, DM and CAD who presented with  Decreased sensorium/confusion and possible left facial droop and LUE neglect.    In the ER, noted to have temp 100.21F, UA concerning for UTI.    Assessment & Plan:  Acute Metabolic Encephalopathy due to UTI, dehydration Treated for UTI, urine culture grew proteus. There was some diagnostic ambiguity about whether he had a stroke, MRI/MRI was negative for stroke and unremarkable other than generalized atrophy. -seroquel 50 mg QHS -supportive care, PT  Proteus Mirabilis UTI  UA with large leukocytes, nitrite positive, many bacteria, >50 WBC.  UC with > 100k proteus, S- ceftriaxone.  Completed abx.  -no symptoms at present, monitor   Acute on chronic Dysphagia  Ongoing for at least 3 years, hx of PEG, since removed / would not want PEG again -comfort feeding  -mechanical soft / puree diet   HTN Stroke secondary prevention BP within acceptable range -continue hydralazine, losartan, toprol, norvasc  Gout No active disease -allopurinol   Disposition Patient is DNR.  Two children live in [place], one daughter in [place].  The daughter in [place] is the POA.  She indicates she has filed for guardian ad litem with the state of [place].  She is not local and states she feels the family here can not care for her parents.  Under review for possible SNF placement [facility]     DVT prophylaxis: SCD's  Code Status: DNR  Family Communication: Reviewed plan of care with daughter Arlana Hoveamica 10/9.  She indicates her sister Dois DavenportSandra [phone]) & brother Gerlene BurdockRichard are his POA.     Consultants:   Palliative Care   Procedures:     Antimicrobials:   Cefepime 10/3 >> 10/4   Vanco  10/3 >> 10/4   Ceftriaxone 10/4 >> 10/6     Subjective: No nursing complaints.  No fever.  No respiratory distress.  Objective: Vitals:   10/20/18 2348 10/21/18 0730 10/21/18 2000 10/22/18 0758  BP: (!) 173/86 (!) 177/90 (!) 184/89 (!) 195/88  Pulse: (!) 55 (!) 54 (!) 57 (!) 59  Resp: 17 13 14 16   Temp: 97.8 F (36.6 C) (!) 97.5 F (36.4 C) 97.8 F (36.6 C) 98.2 F (36.8 C)  TempSrc: Oral Oral Oral Axillary  SpO2: 99% 100% 100%     Intake/Output Summary (Last 24 hours) at 10/22/2018 1454 Last data filed at 10/21/2018 1806 Gross per 24 hour  Intake --  Output 375 ml  Net -375 ml   There were no vitals filed for this visit.  Examination: General appearance: elderly gentleman lying in bed in NAD HEENT: MM pink/moist, no jvd, edentulous   Skin:  Warm/dry, no edema  Cardiac:  s1s2 rrr, 2/6 SEM LSB 4th ICS Respiratory: even/non-labored on room air, lungs bilaterally clear  Abdomen: obese/soft, bsx4 active, tolerating PO's MSK: no acute deformities  Neuro: awake, alert, oriented to self, location (hospital but not city), follows commands, pleasant Psych: calm   Data Reviewed: I have personally reviewed following labs and imaging studies:  CBC: No results for input(s): WBC, NEUTROABS, HGB, HCT, MCV, PLT in the last 168 hours. Basic Metabolic Panel: No results for input(s): NA, K, CL, CO2, GLUCOSE, BUN, CREATININE, CALCIUM, MG, PHOS in  the last 168 hours. GFR: CrCl cannot be calculated (Unknown ideal weight.). Liver Function Tests: No results for input(s): AST, ALT, ALKPHOS, BILITOT, PROT, ALBUMIN in the last 168 hours. No results for input(s): LIPASE, AMYLASE in the last 168 hours. No results for input(s): AMMONIA in the last 168 hours. Coagulation Profile: No results for input(s): INR, PROTIME in the last 168 hours. Cardiac Enzymes: No results for input(s): CKTOTAL, CKMB, CKMBINDEX, TROPONINI in the last 168 hours.   BNP (last 3 results) No results for input(s):  PROBNP in the last 8760 hours.   HbA1C: No results for input(s): HGBA1C in the last 72 hours.   CBG: Recent Labs  Lab 10/17/18 0018 10/17/18 0501 10/17/18 1227 10/17/18 1651 10/18/18 1635  GLUCAP 118* 135* 53* 95 144*   Lipid Profile: No results for input(s): CHOL, HDL, LDLCALC, TRIG, CHOLHDL, LDLDIRECT in the last 72 hours.   Thyroid Function Tests: No results for input(s): TSH, T4TOTAL, FREET4, T3FREE, THYROIDAB in the last 72 hours.   Anemia Panel: No results for input(s): VITAMINB12, FOLATE, FERRITIN, TIBC, IRON, RETICCTPCT in the last 72 hours.   Urine analysis:    Component Value Date/Time   COLORURINE YELLOW 10/14/2018 2105   APPEARANCEUR CLOUDY (A) 10/14/2018 2105   LABSPEC 1.016 10/14/2018 2105   PHURINE 8.0 10/14/2018 2105   GLUCOSEU NEGATIVE 10/14/2018 2105   HGBUR NEGATIVE 10/14/2018 2105   BILIRUBINUR NEGATIVE 10/14/2018 2105   KETONESUR NEGATIVE 10/14/2018 2105   PROTEINUR 30 (A) 10/14/2018 2105   NITRITE POSITIVE (A) 10/14/2018 2105   LEUKOCYTESUR LARGE (A) 10/14/2018 2105   Sepsis Labs: (procalcitonin:4,lacticacidven:4)  ) Recent Results (from the past 240 hour(s))  SARS Coronavirus 2 [facility] order, Performed in [facility] hospital lab) Nasopharyngeal Nasopharyngeal Swab     Status: None   Collection Time: 10/14/18  6:55 PM   Specimen: Nasopharyngeal Swab  Result Value Ref Range Status   SARS Coronavirus 2 NEGATIVE NEGATIVE Final    Comment: (NOTE) If result is NEGATIVE SARS-CoV-2 target nucleic acids are NOT DETECTED. The SARS-CoV-2 RNA is generally detectable in upper and lower  respiratory specimens during the acute phase of infection. The lowest  concentration of SARS-CoV-2 viral copies this assay can detect is 250  copies / mL. A negative result does not preclude SARS-CoV-2 infection  and should not be used as the sole basis for treatment or other  patient management decisions.  A negative result may occur with  improper  specimen collection / handling, submission of specimen other  than nasopharyngeal swab, presence of viral mutation(s) within the  areas targeted by this assay, and inadequate number of viral copies  (<250 copies / mL). A negative result must be combined with clinical  observations, patient history, and epidemiological information. If result is POSITIVE SARS-CoV-2 target nucleic acids are DETECTED. The SARS-CoV-2 RNA is generally detectable in upper and lower  respiratory specimens dur ing the acute phase of infection.  Positive  results are indicative of active infection with SARS-CoV-2.  Clinical  correlation with patient history and other diagnostic information is  necessary to determine patient infection status.  Positive results do  not rule out bacterial infection or co-infection with other viruses. If result is PRESUMPTIVE POSTIVE SARS-CoV-2 nucleic acids MAY BE PRESENT.   A presumptive positive result was obtained on the submitted specimen  and confirmed on repeat testing.  While 2019 novel coronavirus  (SARS-CoV-2) nucleic acids may be present in the submitted sample  additional confirmatory testing may be necessary for epidemiological  and / or clinical management purposes  to differentiate between  SARS-CoV-2 and other Sarbecovirus currently known to infect humans.  If clinically indicated additional testing with an alternate test  methodology [phone]) is advised. The SARS-CoV-2 RNA is generally  detectable in upper and lower respiratory sp ecimens during the acute  phase of infection. The expected result is Negative. Fact Sheet for Patients:  BoilerBrush.com.cy Fact Sheet for Healthcare Providers: https://pope.com/ This test is not yet approved or cleared by the [place] FDA and has been authorized for detection and/or diagnosis of SARS-CoV-2 by FDA under an Emergency Use Authorization (EUA).  This EUA will remain in  effect (meaning this test can be used) for the duration of the COVID-19 declaration under Section 564(b)(1) of the Act, 21 U.S.C. section 360bbb-3(b)(1), unless the authorization is terminated or revoked sooner. Performed at [facility] Lab, [address] [place], [place] [address]   Blood Culture (routine x 2)     Status: None   Collection Time: 10/14/18  7:50 PM   Specimen: BLOOD  Result Value Ref Range Status   Specimen Description BLOOD RIGHT NECK  Final   Special Requests   Final    BOTTLES DRAWN AEROBIC AND ANAEROBIC Blood Culture results may not be optimal due to an inadequate volume of blood received in culture bottles   Culture   Final    NO GROWTH 5 DAYS Performed at [facility], [address] [place], [place] [address]    Report Status 10/19/2018 FINAL  Final  Urine culture     Status: Abnormal   Collection Time: 10/14/18  9:05 PM   Specimen: In/Out Cath Urine  Result Value Ref Range Status   Specimen Description IN/OUT CATH URINE  Final   Special Requests   Final    NONE Performed at [facility], [address], [place], [place] [address]    Culture >=100,000 COLONIES/mL PROTEUS MIRABILIS (A)  Final   Report Status 10/17/2018 FINAL  Final   Organism ID, Bacteria PROTEUS MIRABILIS (A)  Final      Susceptibility   Proteus mirabilis - MIC*    AMPICILLIN <=2 SENSITIVE Sensitive     CEFAZOLIN <=4 SENSITIVE Sensitive     CEFTRIAXONE <=1 SENSITIVE Sensitive     CIPROFLOXACIN <=0.25 SENSITIVE Sensitive     GENTAMICIN <=1 SENSITIVE Sensitive     IMIPENEM 2 SENSITIVE Sensitive     NITROFURANTOIN 256 RESISTANT Resistant     TRIMETH/SULFA <=20 SENSITIVE Sensitive     AMPICILLIN/SULBACTAM <=2 SENSITIVE Sensitive     PIP/TAZO <=4 SENSITIVE Sensitive     * >=100,000 COLONIES/mL PROTEUS MIRABILIS      Radiology Studies: No results found.      Scheduled Meds:  allopurinol  300 mg Oral q morning - 10a   amLODipine  2.5 mg Oral q morning - 10a     hydrALAZINE  50 mg Oral BID   losartan  25 mg Oral q morning - 10a   magnesium oxide  400 mg Oral Daily   metoprolol succinate  25 mg Oral q morning - 10a   pantoprazole  40 mg Oral Daily   Continuous Infusions:  sodium chloride 10 mL/hr at 10/17/18 0400     LOS: 7 days    Time spent: 25 minutes   Canary Brim, AG-ACNP-S [facility]    [facility] 10/22/2018, 2:54 PM     Please page through AMION:  [email or web address] Password [email or web address] If 7PM-7AM, please  contact night-coverage         Attending MD Note:  I have seen and examined the patient with nurse practitioner/physician assistant and agree with the note above which has been edited to reflect our agreed upon history, exam, and assessment/plan.   I have personally reviewed the orders for the patient, which were made under my direction.    Iyan Flett. is a 84 y.o. male with, ongoing, D CHF, hemorrhagic stroke, PAF not on anticoagulation due to fall risk, HTN, eosinophilic esophagitis, DM, and CAD who presented with decreased sensorium/confusion and possible left facial droop and left upper extremity neglect.  In the ER, he was found to have temperature 100.3 F, urinalysis concerning for UTI.  CT head unremarkable.    General appearance:  Elderly adult male, lying in bed, no acute distress, sleeping, slowly arouses with touch.   HEENT: Anicteric, conjunctival pink, lids lashes normal.  No nasal deformity, discharge, or epistaxis.  Lips moist, edentulous, oropharynx moist, no oral lesions. Skin:   Cardiac:  RRR, systolic ejection murmur noted, JVP normal, no lower extremity edema.    Respiratory:  Respiratory effort normal, lungs clear, no rales or wheezing, good air entry Abdomen:  Abdomen soft, no tenderness to palpation or guarding.  MSK:  No deformities or effusions of the large joints of the upper lower extremities bilaterally.  Temporal wasting, thenar wasting, diffuse loss  of subcutaneous muscle mass and fat. Neuro:  Awake and alert, oriented to hospital, but nothing more, not oriented to year, follows simple commands, pleasant.  Moves upper extremities with global weakness but symmetric coordination, speech fluent.     Psych:  Attention normal, psychomotor slowing noted, affect blunted, judgment and insight appear severely impaired      Acute metabolic encephalopathy due to UTI/dehydration Patient admitted.  Initially some diagnostic ambiguity about whether he did have a stroke.  MRI brain negative for acute stroke.  Suspect his encephalopathy was multifactorial from UTI, dehydration in the setting of advanced dementia. This appears to be resolved near his cognitive baseline.      Proteus mirabilis UTI Treated with ceftriaxone, completed course.  No further fever, urinary symptoms.  Chronic dysphagia Ongoing for the last 3 years.  He previously had a PEG tube, this is since been removed. We will transition to mechanical soft diet/pure as the patient appears to have much better oral intake with these foods Family understand aspiration risk, but given his previous experience with a PEG tube, they would prefer oral intake over enteral nutrition.  Hypertension Stroke secondary prevention Blood pressure controlled -Continue Norvasc, hydralazine, losartan, Toprol  Gout -Continue allopurinol    MDM The above labs and imaging reports reviewed and summarized above.  Medication management as above.    The patient was admitted with confusion in the setting of UTI superimposed on baseline dementia.  He has had a change in his functional status (previously able to participate in self-cares, feed self, and ambulate, currently not able to ambulate, or feed self).  He will need significant rehabilitation in order to cover to his prior level of function.    [place]

## 2018-10-22 NOTE — Progress Notes (Signed)
PROGRESS NOTE    Mason LoserFoster Tierney Jr.  WUJ:[id number] DOB: 02/03/1934 DOA: 10/14/2018 PCP: Zoila ShutterWoodyear, Wynne E, MD   Brief Narrative:  [name] is an 84 y.o. M with advanced dementia home dwelling, dCHF, hemorrhagic stroke, pAF not on AC due to fall risk, HTN, eosinophilic esophagitis, DM and CAD who presented with  Decreased sensorium/confusion and possible left facial droop and LUE neglect.    In the ER, noted to have temp 100.42F, UA concerning for UTI.    Assessment & Plan:  Acute Metabolic Encephalopathy due to UTI, dehydration Treated for UTI, urine culture grew proteus. There was some diagnostic ambiguity about whether he had a stroke, MRI/MRI was negative for stroke and unremarkable other than generalized atrophy. -supportive care, PT efforts  -seroquel 50 mg QHS   Proteus Mirabilis UTI  UA with large leukocytes, nitrite positive, many bacteria, >50 WBC.  UC with > 100k proteus, S- ceftriaxone.  Completed abx.  -no acute symptoms, completed abx  Acute on chronic Dysphagia  Ongoing for at least 3 years, hx of PEG, since removed -feeding as tolerated, family willing to accept aspiration risk  -continue mechanical soft diet (edentulous)  HTN Stroke secondary prevention BP within acceptable range -continue hydralazine, losartan, toprol, norvasc,   Gout No active disease -continue allopurinol   Disposition Patient is DNR.  Two children live in [place], one daughter in [place].  The daughter in [place] is the POA.  She indicates she has filed for guardian ad litem with the state of [place].  She is not local and states she feels the family here can not care for her parents.  Under review for possible SNF placement [facility] He is not a candidate for residential hospice at this time.   DVT prophylaxis: SCD's  Code Status: DNR  Family Communication: Reviewed plan of care with daughter Arlana Hoveamica 10/9.  She indicates her sister Dois DavenportSandra [phone]) & brother Gerlene BurdockRichard are his POA.         Consultants:   Palliative Care   Procedures:     Antimicrobials:   Cefepime 10/3 >> 10/4   Vanco 10/3 >> 10/4   Ceftriaxone 10/4 >> 10/6     Subjective: No acute events per RN.  Afebrile / VSS.   Objective: Vitals:   10/20/18 2348 10/21/18 0730 10/21/18 2000 10/22/18 0758  BP: (!) 173/86 (!) 177/90 (!) 184/89 (!) 195/88  Pulse: (!) 55 (!) 54 (!) 57 (!) 59  Resp: 17 13 14 16   Temp: 97.8 F (36.6 C) (!) 97.5 F (36.4 C) 97.8 F (36.6 C) 98.2 F (36.8 C)  TempSrc: Oral Oral Oral Axillary  SpO2: 99% 100% 100%     Intake/Output Summary (Last 24 hours) at 10/22/2018 1304 Last data filed at 10/21/2018 1806 Gross per 24 hour  Intake -  Output 375 ml  Net -375 ml   There were no vitals filed for this visit.  Examination: General appearance: frail elderly gentleman lying in bed in NAD HEENT: MM pink/moist, no jvd, edentulous  Skin:  Warm/dry, no edema   Cardiac:  s1s2 rrr, soft SEM at 4th ISC LSB Respiratory: even/non-labored on RA, lungs bilaterally clear   Abdomen: obese / soft, bsx4 active, tolerating PO's  MSK: no acute deformities   Neuro: awake, alert, oriented to self, type of building, MAE  Psych: calm/pleasant    Data Reviewed: I have personally reviewed following labs and imaging studies:  CBC: No results for input(s): WBC, NEUTROABS, HGB, HCT, MCV, PLT in the last  168 hours. Basic Metabolic Panel: No results for input(s): NA, K, CL, CO2, GLUCOSE, BUN, CREATININE, CALCIUM, MG, PHOS in the last 168 hours. GFR: CrCl cannot be calculated (Unknown ideal weight.). Liver Function Tests: No results for input(s): AST, ALT, ALKPHOS, BILITOT, PROT, ALBUMIN in the last 168 hours. No results for input(s): LIPASE, AMYLASE in the last 168 hours. No results for input(s): AMMONIA in the last 168 hours. Coagulation Profile: No results for input(s): INR, PROTIME in the last 168 hours. Cardiac Enzymes: No results for input(s): CKTOTAL, CKMB, CKMBINDEX,  TROPONINI in the last 168 hours.   BNP (last 3 results) No results for input(s): PROBNP in the last 8760 hours.   HbA1C: No results for input(s): HGBA1C in the last 72 hours.   CBG: Recent Labs  Lab 10/17/18 0018 10/17/18 0501 10/17/18 1227 10/17/18 1651 10/18/18 1635  GLUCAP 118* 135* 53* 95 144*   Lipid Profile: No results for input(s): CHOL, HDL, LDLCALC, TRIG, CHOLHDL, LDLDIRECT in the last 72 hours.   Thyroid Function Tests: No results for input(s): TSH, T4TOTAL, FREET4, T3FREE, THYROIDAB in the last 72 hours.   Anemia Panel: No results for input(s): VITAMINB12, FOLATE, FERRITIN, TIBC, IRON, RETICCTPCT in the last 72 hours.   Urine analysis:    Component Value Date/Time   COLORURINE YELLOW 10/14/2018 2105   APPEARANCEUR CLOUDY (A) 10/14/2018 2105   LABSPEC 1.016 10/14/2018 2105   PHURINE 8.0 10/14/2018 2105   GLUCOSEU NEGATIVE 10/14/2018 2105   HGBUR NEGATIVE 10/14/2018 2105   [place] NEGATIVE 10/14/2018 2105   [place] NEGATIVE 10/14/2018 2105   PROTEINUR 30 (A) 10/14/2018 2105   NITRITE POSITIVE (A) 10/14/2018 2105   LEUKOCYTESUR LARGE (A) 10/14/2018 2105   Sepsis Labs: @LABRCNTIP (procalcitonin:4,lacticacidven:4)  ) Recent Results (from the past 240 hour(s))  SARS Coronavirus 2  [facility] order, Performed in [facility] hospital lab) Nasopharyngeal Nasopharyngeal Swab     Status: None   Collection Time: 10/14/18  6:55 PM   Specimen: Nasopharyngeal Swab  Result Value Ref Range Status   SARS Coronavirus 2 NEGATIVE NEGATIVE Final    Comment: (NOTE) If result is NEGATIVE SARS-CoV-2 target nucleic acids are NOT DETECTED. The SARS-CoV-2 RNA is generally detectable in upper and lower  respiratory specimens during the acute phase of infection. The lowest  concentration of SARS-CoV-2 viral copies this assay can detect is 250  copies / mL. A negative result does not preclude SARS-CoV-2 infection  and should not be used as the sole basis for treatment or  other  patient management decisions.  A negative result may occur with  improper specimen collection / handling, submission of specimen other  than nasopharyngeal swab, presence of viral mutation(s) within the  areas targeted by this assay, and inadequate number of viral copies  (<250 copies / mL). A negative result must be combined with clinical  observations, patient history, and epidemiological information. If result is POSITIVE SARS-CoV-2 target nucleic acids are DETECTED. The SARS-CoV-2 RNA is generally detectable in upper and lower  respiratory specimens dur ing the acute phase of infection.  Positive  results are indicative of active infection with SARS-CoV-2.  Clinical  correlation with patient history and other diagnostic information is  necessary to determine patient infection status.  Positive results do  not rule out bacterial infection or co-infection with other viruses. If result is PRESUMPTIVE POSTIVE SARS-CoV-2 nucleic acids MAY BE PRESENT.   A presumptive positive result was obtained on the submitted specimen  and confirmed on repeat testing.  While 2019 novel coronavirus  (  SARS-CoV-2) nucleic acids may be present in the submitted sample  additional confirmatory testing may be necessary for epidemiological  and / or clinical management purposes  to differentiate between  SARS-CoV-2 and other Sarbecovirus currently known to infect humans.  If clinically indicated additional testing with an alternate test  methodology [phone]) is advised. The SARS-CoV-2 RNA is generally  detectable in upper and lower respiratory sp ecimens during the acute  phase of infection. The expected result is Negative. Fact Sheet for Patients:  BoilerBrush.com.cy Fact Sheet for Healthcare Providers: https://pope.com/ This test is not yet approved or cleared by the [place] FDA and has been authorized for detection and/or diagnosis of  SARS-CoV-2 by FDA under an Emergency Use Authorization (EUA).  This EUA will remain in effect (meaning this test can be used) for the duration of the COVID-19 declaration under Section 564(b)(1) of the Act, 21 U.S.C. section 360bbb-3(b)(1), unless the authorization is terminated or revoked sooner. Performed at [facility], [address] [place], [place] [address]   Blood Culture (routine x 2)     Status: None   Collection Time: 10/14/18  7:50 PM   Specimen: BLOOD  Result Value Ref Range Status   Specimen Description BLOOD RIGHT NECK  Final   Special Requests   Final    BOTTLES DRAWN AEROBIC AND ANAEROBIC Blood Culture results may not be optimal due to an inadequate volume of blood received in culture bottles   Culture   Final    NO GROWTH 5 DAYS Performed at [facility], [address] [place], [place] [address]    Report Status 10/19/2018 FINAL  Final  Urine culture     Status: Abnormal   Collection Time: 10/14/18  9:05 PM   Specimen: In/Out Cath Urine  Result Value Ref Range Status   Specimen Description IN/OUT CATH URINE  Final   Special Requests   Final    NONE Performed at [facility], [address] [place], [place] [address]    Culture >=100,000 COLONIES/mL PROTEUS MIRABILIS (A)  Final   Report Status 10/17/2018 FINAL  Final   Organism ID, Bacteria PROTEUS MIRABILIS (A)  Final      Susceptibility   Proteus mirabilis - MIC*    AMPICILLIN <=2 SENSITIVE Sensitive     CEFAZOLIN <=4 SENSITIVE Sensitive     CEFTRIAXONE <=1 SENSITIVE Sensitive     CIPROFLOXACIN <=0.25 SENSITIVE Sensitive     GENTAMICIN <=1 SENSITIVE Sensitive     IMIPENEM 2 SENSITIVE Sensitive     NITROFURANTOIN 256 RESISTANT Resistant     TRIMETH/SULFA <=20 SENSITIVE Sensitive     AMPICILLIN/SULBACTAM <=2 SENSITIVE Sensitive     PIP/TAZO <=4 SENSITIVE Sensitive     * >=100,000 COLONIES/mL PROTEUS MIRABILIS      Radiology Studies: No results found.      Scheduled Meds:  . allopurinol  300 mg Oral q morning - 10a  . amLODipine  2.5 mg Oral q morning - 10a  . hydrALAZINE  50 mg Oral BID  . losartan  25 mg Oral q morning - 10a  . magnesium oxide  400 mg Oral Daily  . metoprolol succinate  25 mg Oral q morning - 10a  . pantoprazole  40 mg Oral Daily   Continuous Infusions: . sodium chloride 10 mL/hr at 10/17/18 0400     LOS: 7 days    Time spent: 25 minutes   Canary Brim, AG-ACNP-S [employer]    [facility] 10/22/2018, 1:04 PM     Please page through AMION:  [email or web address] Password [email or web address] If 7PM-7AM, please contact night-coverage

## 2018-10-23 LAB — NOVEL CORONAVIRUS, NAA (HOSP ORDER, SEND-OUT TO REF LAB; TAT 18-24 HRS): SARS-CoV-2, NAA: NOT DETECTED

## 2018-10-23 NOTE — TOC Progression Note (Signed)
Transition of Care (TOC) - Progression Note    Patient Details  Name: Mason Webb. MRN: [id number] Date of Birth: May 31, 1934  Transition of Care [facility]) CM/SW [place], [place] Phone Number: 10/23/2018, 1:58 PM  Clinical Narrative:   CSW following for discharge plan. CSW confirmed bed availability with [facility], they are able to take patient. COVID test already ordered, needs insurance authorization. PT update needed, CSW asked MD to order. PT sent a message that they will see patient this afternoon. CSW to start insurance auth after PT update is in the chart.    Expected Discharge Plan: Skilled Nursing Facility Barriers to Discharge: Ship broker, Continued Medical Work up  Expected Discharge Plan and Services Expected Discharge Plan: [place] In-house Referral: Clinical Social Work   Post Acute Care Choice: [place] Living arrangements for the past 2 months: Single Family Home                           HH Arranged: NA           Social Determinants of Health (SDOH) Interventions    Readmission Risk Interventions No flowsheet data found.

## 2018-10-23 NOTE — Evaluation (Signed)
Physical Therapy Re-Evaluation Patient Details Name: Mason Webb. MRN: [id number] DOB: 01-Apr-1934 Today's Date: 10/23/2018   History of Present Illness  Pt is an 84 y/o male admitted secondary to AMS likely due to UTI. PMH includes HTN, CVA, and HLD.   Clinical Impression  Pt seen for re-evaluation following discontinuation of orders. Pt now requiring mod to max A +2 to perform basic bed mobility tasks and required assist to maintain sitting balance. Pt also noted to have flaking skin on bottom and noted bleeding scratch marks. RN present in room to address. Unsure of baseline cognition, however, pt very easily distracted and requiring multimodal cues to perform mobility tasks. Per notes, plan is to go to SNF at d/c. Feel this is appropriate given increased assist required and high fall risk. Will continue to follow acutely to maximize functional mobility independence and safety.     Follow Up Recommendations SNF;Supervision/Assistance - 24 hour    Equipment Recommendations  None recommended by PT    Recommendations for Other Services       Precautions / Restrictions Precautions Precautions: Fall Restrictions Weight Bearing Restrictions: No      Mobility  Bed Mobility Overal bed mobility: Needs Assistance Bed Mobility: Supine to Sit;Sit to Supine   Sidelying to sit: Mod assist;Max assist;+2 for physical assistance Supine to sit: Mod assist;Max assist;+2 for physical assistance     General bed mobility comments: Mod to max A +2 to perform bed mobility tasks. Required assist to maintain sitting balance. Once sitting, pt leaning to the L and scratching at bottom area. Noted bleeding; RN in room and addressed. Returned to supine.   Transfers                    Ambulation/Gait                Stairs            Wheelchair Mobility    Modified Rankin (Stroke Patients Only)       Balance Overall balance assessment: Needs  assistance Sitting-balance support: No upper extremity supported;Feet supported Sitting balance-Leahy Scale: Poor Sitting balance - Comments: Reliant on external assist to maintain sitting balance.                                      Pertinent Vitals/Pain Pain Assessment: Faces Faces Pain Scale: No hurt    Home Living Family/patient expects to be discharged to:: Skilled nursing facility                      Prior Function           Comments: Unsure of PLOF; pt unable to provide.      Hand Dominance        Extremity/Trunk Assessment   Upper Extremity Assessment Upper Extremity Assessment: Defer to OT evaluation    Lower Extremity Assessment Lower Extremity Assessment: Generalized weakness    Cervical / Trunk Assessment Cervical / Trunk Assessment: Kyphotic  Communication   Communication: HOH;Expressive difficulties(only responds to yes/no)  Cognition Arousal/Alertness: Awake/alert Behavior During Therapy: Flat affect Overall Cognitive Status: No family/caregiver present to determine baseline cognitive functioning                                 General Comments: Pt with dementia at baseline. Demonstrated difficulty  sequencing and slowed processing throughout.       General Comments      Exercises     Assessment/Plan    PT Assessment Patient needs continued PT services  PT Problem List Decreased strength;Decreased range of motion;Decreased activity tolerance;Decreased balance;Decreased mobility;Decreased coordination;Decreased cognition;Decreased knowledge of use of DME;Decreased safety awareness;Decreased knowledge of precautions       PT Treatment Interventions DME instruction;Gait training;Stair training;Functional mobility training;Therapeutic activities;Therapeutic exercise;Balance training;Neuromuscular re-education;Cognitive remediation;Patient/family education    PT Goals (Current goals can be found in the  Care Plan section)  Acute Rehab PT Goals PT Goal Formulation: Patient unable to participate in goal setting Time For Goal Achievement: 11/06/18 Potential to Achieve Goals: Fair    Frequency Min 2X/week   Barriers to discharge Decreased caregiver support      Co-evaluation               AM-PAC PT "6 Clicks" Mobility  Outcome Measure Help needed turning from your back to your side while in a flat bed without using bedrails?: A Lot Help needed moving from lying on your back to sitting on the side of a flat bed without using bedrails?: Total Help needed moving to and from a bed to a chair (including a wheelchair)?: Total Help needed standing up from a chair using your arms (e.g., wheelchair or bedside chair)?: Total Help needed to walk in hospital room?: Total Help needed climbing 3-5 steps with a railing? : Total 6 Click Score: 7    End of Session   Activity Tolerance: Patient tolerated treatment well Patient left: in bed;with call bell/phone within reach;with bed alarm set;with nursing/sitter in room Nurse Communication: Mobility status;Other (comment)(bleeding; skin breakdown on bottom) PT Visit Diagnosis: Unsteadiness on feet (R26.81);Other abnormalities of gait and mobility (R26.89);Muscle weakness (generalized) (M62.81)    Time: 4650-3546 PT Time Calculation (min) (ACUTE ONLY): 15 min   Charges:   PT Evaluation $PT Re-evaluation: 1 Re-eval          Gladys Damme, PT, DPT  Acute Rehabilitation Services  Pager: [phone] Office: [phone]   [facility] Prom 10/23/2018, 4:20 PM

## 2018-10-23 NOTE — Progress Notes (Addendum)
PROGRESS NOTE    Mason LoserFoster Deltoro Jr.  JYN:[id number] DOB: 09/03/1934 DOA: 10/14/2018 PCP: Zoila ShutterWoodyear, Wynne E, MD   Brief Narrative:  [name] is an 84 y.o. M with advanced dementia home dwelling, dCHF, hemorrhagic stroke, pAF not on AC due to fall risk, HTN, eosinophilic esophagitis, DM and CAD who presented with  Decreased sensorium/confusion and possible left facial droop and LUE neglect.    In the ER, noted to have temp 100.5F, UA concerning for UTI.    Assessment & Plan:  Acute Metabolic Encephalopathy due to UTI, dehydration Treated for UTI, urine culture grew proteus. There was some diagnostic ambiguity about whether he had a stroke, MRI/MRI was negative for stroke and unremarkable other than generalized atrophy. -continue supportive measures  -encourage PT / mobilization  -seroquel 50mg  QHS   Proteus Mirabilis UTI  UA with large leukocytes, nitrite positive, many bacteria, >50 WBC.  UC with > 100k proteus, S- ceftriaxone.  Completed abx.  -monitor, no acute symptoms   Acute on chronic Dysphagia  Ongoing for at least 3 years, hx of PEG, since removed -comfort feeding as tolerated  -continue soft diet (edentulous)  HTN Stroke secondary prevention BP within acceptable range -continue hydralazine, losartan, toprol, norvasc  Gout No active disease -continue allopurinol  Disposition Patient is DNR.  Two children live in [place], one daughter in [place].  The daughter in [place] is the POA.  She indicates she has filed for guardian ad litem with the state of [place].  She is not local and states she feels the family here can not care for her parents.  Under review for possible SNF placement [facility]       DVT prophylaxis: SCD's  Code Status: DNR  Family Communication:  Dois DavenportSandra [phone]) & brother Gerlene BurdockRichard are his POA.   SNF placement pending.  Dois DavenportSandra updated via phone 10/12 on plan of care.  Await SNF bed placement.     Consultants:   Palliative Care    Procedures:     Antimicrobials:   Cefepime 10/3 >> 10/4   Vanco 10/3 >> 10/4   Ceftriaxone 10/4 >> 10/6     Subjective: RN reports pt slept well overnight. Tolerating diet.  No acute events.   Objective: Vitals:   10/23/18 0008 10/23/18 0312 10/23/18 0400 10/23/18 0846  BP: (!) 174/95 (!) 180/93 (!) 167/98 (!) 158/95  Pulse: 69 69 69 72  Resp: 18 18 18 14   Temp: 97.6 F (36.4 C)   97.6 F (36.4 C)  TempSrc: Oral   Oral  SpO2: 100% 99% 99% 99%    Intake/Output Summary (Last 24 hours) at 10/23/2018 1144 Last data filed at 10/22/2018 2239 Gross per 24 hour  Intake 60 ml  Output -  Net 60 ml   There were no vitals filed for this visit.  Examination: General appearance: elderly gentleman lying in bed in NAD HEENT: MM pink/moist, edentulous, no jvd, HOH   Skin:  Warm/dry, no edema, rashes or lesions  Cardiac:  s1s2 RRR, soft SEM at 4th ICS LSB   Respiratory: even/non-labored on RA, lungs clear bilaterally   Abdomen: tolerating PO's, abd soft, non-tender with palpation MSK: no acute deformities    Neuro: awake, alert, oriented to self, follows commands, interacts appropriately / MAD Psych: pleasant, calm   Data Reviewed: I have personally reviewed following labs and imaging studies:  CBC: No results for input(s): WBC, NEUTROABS, HGB, HCT, MCV, PLT in the last 168 hours. Basic Metabolic Panel: No results for input(s):  NA, K, CL, CO2, GLUCOSE, BUN, CREATININE, CALCIUM, MG, PHOS in the last 168 hours. GFR: CrCl cannot be calculated (Unknown ideal weight.). Liver Function Tests: No results for input(s): AST, ALT, ALKPHOS, BILITOT, PROT, ALBUMIN in the last 168 hours. No results for input(s): LIPASE, AMYLASE in the last 168 hours. No results for input(s): AMMONIA in the last 168 hours. Coagulation Profile: No results for input(s): INR, PROTIME in the last 168 hours. Cardiac Enzymes: No results for input(s): CKTOTAL, CKMB, CKMBINDEX, TROPONINI in the last 168  hours.   BNP (last 3 results) No results for input(s): PROBNP in the last 8760 hours.   HbA1C: No results for input(s): HGBA1C in the last 72 hours.   CBG: Recent Labs  Lab 10/17/18 0018 10/17/18 0501 10/17/18 1227 10/17/18 1651 10/18/18 1635  GLUCAP 118* 135* 53* 95 144*   Lipid Profile: No results for input(s): CHOL, HDL, LDLCALC, TRIG, CHOLHDL, LDLDIRECT in the last 72 hours.   Thyroid Function Tests: No results for input(s): TSH, T4TOTAL, FREET4, T3FREE, THYROIDAB in the last 72 hours.   Anemia Panel: No results for input(s): VITAMINB12, FOLATE, FERRITIN, TIBC, IRON, RETICCTPCT in the last 72 hours.   Urine analysis:    Component Value Date/Time   COLORURINE YELLOW 10/14/2018 2105   APPEARANCEUR CLOUDY (A) 10/14/2018 2105   LABSPEC 1.016 10/14/2018 2105   PHURINE 8.0 10/14/2018 2105   GLUCOSEU NEGATIVE 10/14/2018 2105   HGBUR NEGATIVE 10/14/2018 2105   BILIRUBINUR NEGATIVE 10/14/2018 2105   KETONESUR NEGATIVE 10/14/2018 2105   PROTEINUR 30 (A) 10/14/2018 2105   NITRITE POSITIVE (A) 10/14/2018 2105   LEUKOCYTESUR LARGE (A) 10/14/2018 2105   Sepsis Labs: @LABRCNTIP (procalcitonin:4,lacticacidven:4)  ) Recent Results (from the past 240 hour(s))  SARS Coronavirus 2 [facility] order, Performed in [facility] hospital lab) Nasopharyngeal Nasopharyngeal Swab     Status: None   Collection Time: 10/14/18  6:55 PM   Specimen: Nasopharyngeal Swab  Result Value Ref Range Status   SARS Coronavirus 2 NEGATIVE NEGATIVE Final    Comment: (NOTE) If result is NEGATIVE SARS-CoV-2 target nucleic acids are NOT DETECTED. The SARS-CoV-2 RNA is generally detectable in upper and lower  respiratory specimens during the acute phase of infection. The lowest  concentration of SARS-CoV-2 viral copies this assay can detect is 250  copies / mL. A negative result does not preclude SARS-CoV-2 infection  and should not be used as the sole basis for treatment or other  patient management  decisions.  A negative result may occur with  improper specimen collection / handling, submission of specimen other  than nasopharyngeal swab, presence of viral mutation(s) within the  areas targeted by this assay, and inadequate number of viral copies  (<250 copies / mL). A negative result must be combined with clinical  observations, patient history, and epidemiological information. If result is POSITIVE SARS-CoV-2 target nucleic acids are DETECTED. The SARS-CoV-2 RNA is generally detectable in upper and lower  respiratory specimens dur ing the acute phase of infection.  Positive  results are indicative of active infection with SARS-CoV-2.  Clinical  correlation with patient history and other diagnostic information is  necessary to determine patient infection status.  Positive results do  not rule out bacterial infection or co-infection with other viruses. If result is PRESUMPTIVE POSTIVE SARS-CoV-2 nucleic acids MAY BE PRESENT.   A presumptive positive result was obtained on the submitted specimen  and confirmed on repeat testing.  While 2019 novel coronavirus  (SARS-CoV-2) nucleic acids may be present in the  submitted sample  additional confirmatory testing may be necessary for epidemiological  and / or clinical management purposes  to differentiate between  SARS-CoV-2 and other Sarbecovirus currently known to infect humans.  If clinically indicated additional testing with an alternate test  methodology [phone]) is advised. The SARS-CoV-2 RNA is generally  detectable in upper and lower respiratory sp ecimens during the acute  phase of infection. The expected result is Negative. Fact Sheet for Patients:  BoilerBrush.com.cy Fact Sheet for Healthcare Providers: https://pope.com/ This test is not yet approved or cleared by the [place] FDA and has been authorized for detection and/or diagnosis of SARS-CoV-2 by FDA under an  Emergency Use Authorization (EUA).  This EUA will remain in effect (meaning this test can be used) for the duration of the COVID-19 declaration under Section 564(b)(1) of the Act, 21 U.S.C. section 360bbb-3(b)(1), unless the authorization is terminated or revoked sooner. Performed at [facility], [address] [place], [place] [address]   Blood Culture (routine x 2)     Status: None   Collection Time: 10/14/18  7:50 PM   Specimen: BLOOD  Result Value Ref Range Status   Specimen Description BLOOD RIGHT NECK  Final   Special Requests   Final    BOTTLES DRAWN AEROBIC AND ANAEROBIC Blood Culture results may not be optimal due to an inadequate volume of blood received in culture bottles   Culture   Final    NO GROWTH 5 DAYS Performed at [facility], [address] [place], [place] [address]    Report Status 10/19/2018 FINAL  Final  Urine culture     Status: Abnormal   Collection Time: 10/14/18  9:05 PM   Specimen: In/Out Cath Urine  Result Value Ref Range Status   Specimen Description IN/OUT CATH URINE  Final   Special Requests   Final    NONE Performed at [facility], [address], [place], [place] [address]    Culture >=100,000 COLONIES/mL PROTEUS MIRABILIS (A)  Final   Report Status 10/17/2018 FINAL  Final   Organism ID, Bacteria PROTEUS MIRABILIS (A)  Final      Susceptibility   Proteus mirabilis - MIC*    AMPICILLIN <=2 SENSITIVE Sensitive     CEFAZOLIN <=4 SENSITIVE Sensitive     CEFTRIAXONE <=1 SENSITIVE Sensitive     CIPROFLOXACIN <=0.25 SENSITIVE Sensitive     GENTAMICIN <=1 SENSITIVE Sensitive     IMIPENEM 2 SENSITIVE Sensitive     NITROFURANTOIN 256 RESISTANT Resistant     TRIMETH/SULFA <=20 SENSITIVE Sensitive     AMPICILLIN/SULBACTAM <=2 SENSITIVE Sensitive     PIP/TAZO <=4 SENSITIVE Sensitive     * >=100,000 COLONIES/mL PROTEUS MIRABILIS  Novel Coronavirus, NAA (hospital order; send-out to ref lab)     Status: None   Collection Time:  10/22/18  4:26 PM   Specimen: Nasopharyngeal Swab; Respiratory  Result Value Ref Range Status   SARS-CoV-2, NAA NOT DETECTED NOT DETECTED Final    Comment: (NOTE) This nucleic acid amplification test was developed and its performance characteristics determined by [employer]. Nucleic acid amplification tests include PCR and TMA. This test has not been FDA cleared or approved. This test has been authorized by FDA under an Emergency Use Authorization (EUA). This test is only authorized for the duration of time the declaration that circumstances exist justifying the authorization of the emergency use of in vitro diagnostic tests for detection of SARS-CoV-2 virus and/or diagnosis of COVID-19 infection  under section 564(b)(1) of the Act, 21 U.S.C. 360bbb-3(b) (1), unless the authorization is terminated or revoked [id number]-[id number](A When diagnostic testing is negative, the possibility of a false negative result should be considered in the context of a patient's recent exposures and the presence of clinical signs and symptoms consistent with COVID-19. An individual without symptoms of COVID- 19 and who is not shedding SARS-CoV-2 vi rus would expect to have a negative (not detected) result in this assay. Performed At: [facility] [address] [place], [place] [id number] Jolene Schimke MD YN:[id number]    Coronavirus Source NASOPHARYNGEAL  Final    Comment: Performed at [facility], [address] [place], [place] [address]      Radiology Studies: No results found.      Scheduled Meds: . allopurinol  300 mg Oral q morning - 10a  . amLODipine  2.5 mg Oral q morning - 10a  . hydrALAZINE  50 mg Oral BID  . losartan  25 mg Oral q morning - 10a  . magnesium oxide  400 mg Oral Daily  . metoprolol succinate  25 mg Oral q morning - 10a  . pantoprazole  40 mg Oral Daily   Continuous Infusions: . sodium chloride 10 mL/hr at 10/17/18 0400     LOS: 8 days    Time  spent: 25 minutes   Canary Brim, AG-ACNP-S [facility]    [facility] 10/23/2018, 11:44 AM     Please page through AMION:  [email or web address] Password [email or web address] If 7PM-7AM, please contact night-coverage       Attending MD Note:  I have seen and examined the patient with nurse practitioner/physician assistant and agree with the note above which has been edited to reflect our agreed upon history, exam, and assessment/plan.   I have personally reviewed the orders for the patient, which were made under my direction.  Mckinnley Smithey Webbis a 84 y.o.malewith, ongoing, D CHF, hemorrhagic stroke, PAF not on anticoagulation due to fall risk, HTN, eosinophilic esophagitis, DM, and CAD who presented with decreased sensorium/confusion and possible left facial droop and left upper extremity neglect.  In the ER, he was found to have temperature 100.3 F, urinalysis concerning for UTI. CT head unremarkable.    General appearance:  Elderly adult male, sleeping, arouses to touch.  Interactive. HEENT:  Anicteric, conjunctival pink, lids lashes normal.  No nasal forming, discharge, epistaxis.  Edentulous, oropharynx moist. Skin:  Cardiac:  RRR, soft SEM, no lower extremity edema  Respiratory:  Gwyndolyn Kaufman effort normal, no rales or wheezes. Abdomen:  Abdomen soft, no tenderness to palpation no guarding. MSK:  Diffuse loss of subcutaneous muscle mass and fat. Neuro:  Sleeping but arouses easily, one-word answers.  Moves upper extremities with psychomotor slowing, global weakness, symmetric.  Speech slow but fluent. Psych:  Attention diminished, psychomotor slowing noted, affect blunted.      Acute metabolic encephalopathy due to UTI/dehydration Patient admitted. Initially some diagnostic ambiguity about whether he did have a stroke. MRI brain negative for acute stroke.  Suspect his encephalopathy was multifactorial from UTI, dehydration in the  setting of advanced dementia. This appears to be resolved near his cognitive baseline.      Proteus mirabilis UTI Treated with ceftriaxone, completed course. No further fever, urinary symptoms.  Chronic dysphagia Ongoing for the last 3 years. He previously had a PEG tube, this is since been removed. We will transition to mechanical soft diet/pure as the patient appears to have much better oral intake with these  foods Family understand aspiration risk, but given his previous experience with a PEG tube, they would prefer oral intake over enteral nutrition.  Hypertension Stroke secondary prevention Blood pressure  labile, controlled this afternoon -Continue Norvasc, hydralazine, losartan, Toprol  Gout -Continue allopurinol  Severe protein calorie malnutrition As evidenced by poor p.o. intake, diffuse loss of subcutaneous muscle mass and fat.    MDM The above labs and imaging reports reviewed and summarized above.  Medication management as above.  The patient was admitted with confusion in the setting of UTI superimposed on baseline dementia. He has had a change in his functional status (previously able to participate in self-cares, feed self, and ambulate, currently not able to ambulate, or feed self). Will expect he will need significant rehabilitation in order to recover expire level of function.  [place]

## 2018-10-24 DIAGNOSIS — R627 Adult failure to thrive: Secondary | ICD-10-CM

## 2018-10-24 NOTE — Progress Notes (Signed)
Report given to [facility] @ [facility].

## 2018-10-24 NOTE — TOC Progression Note (Signed)
Transition of Care (TOC) - Progression Note    Patient Details  Name: Mason Webb. MRN: [id number] Date of Birth: 1934/05/24  Transition of Care [facility]) CM/SW [place], [place] Phone Number: 10/24/2018, 9:12 AM  Clinical Narrative:   CSW faxed authorization request to [facility] for [facility] authorization. Awaiting insurance auth for SNF.    Expected Discharge Plan: Skilled Nursing Facility Barriers to Discharge: Insurance Authorization  Expected Discharge Plan and Services Expected Discharge Plan: [place] In-house Referral: Clinical Social Work   Post Acute Care Choice: [place] Living arrangements for the past 2 months: Single Family Home                           HH Arranged: NA           Social Determinants of Health (SDOH) Interventions    Readmission Risk Interventions No flowsheet data found.

## 2018-10-24 NOTE — Discharge Summary (Signed)
Physician Discharge Summary  Mason Webb. GBT:[id number] DOB: 1934-01-21 DOA: 10/14/2018  PCP: Burman Freestone, MD  Admit date: 10/14/2018 Discharge date: 10/24/2018  Admitted From: Home Disposition:  SNF      Recommendations for Outpatient Follow-up:  1. Monitor progress with PT and weight/oral intake --> if function/cognition worsening, or weight loss noted, consider Palliative Care referral     Home Health: TBD at SNF  Equipment/Devices: TBD at SNF  Discharge Condition: Stable  CODE STATUS: Full Code  Diet recommendation: Regular diet, soft textures.  Comfort feeding.       Brief/Interim Summary: Mason Webb is an 84 y.o. M with advanced dementia home dwelling, dCHF, hemorrhagic stroke, pAF not on AC due to fall risk, HTN, eosinophilic esophagitis, DM and CAD who presented with  Decreased sensorium/confusion and possible left facial droop and LUE neglect.    In the ER, noted to have temp 100.52F, UA concerning for UTI.      PRINCIPAL HOSPITAL DIAGNOSIS: Acute metabolic encephalopathy due to Proteus UTI, dehydration    Discharge Diagnoses:   Acute Metabolic Encephalopathy  In setting of proteus UTI, dehydration and underlying dementia. Initially diagnostic ambiguity about whether he had a stroke.  MRI brain negative for acute stroke.  Seroquel stopped due to sleepiness.  Resolved to cognitive baseline with IV fluids and antibiotics.    Proteus Mirabilis UTI  Treated with Rocephin, course completed while inpatient. Clinically resolved.  Chronic Dysphagia Hx of prior PEG, since removed.  Ongoing for at least 3 years.  Tolerating soft diet here.  Palliative Care discussed replacing PEG with family, who felt this was not within patient's wishes, aspiration risk is known and patient's wishes would be for oral feeding over further enteral nutrition.   HTN Secondary Stroke Prevention  BP within acceptable range.  Continue home hydralazine, losartan,  Toprol, Norvasc  Gout No acute disease. Continue allopurinol   Severe Protein Calorie Malnutrition  Poor PO intake, loss of fat/muscle mass.  Diet as tolerated.   Goals of Care Family met with Palliative Care during hospitalization.  DNR.  No aggressive measures.  Continue supportive care.  Family has sought to have the patient be appointed a guardian ad litem with the state.          Discharge Instructions  Discharge Instructions    Call MD for:  difficulty breathing, headache or visual disturbances   Complete by: As directed    Call MD for:  persistant nausea and vomiting   Complete by: As directed    Call MD for:  severe uncontrolled pain   Complete by: As directed    Call MD for:  temperature >100.4   Complete by: As directed    Diet general   Complete by: As directed    Mechanical soft diet   Discharge instructions   Complete by: As directed    1. Continue comfort feeding 2. DNR/DNI, comfort measures if patient fails --> consider Palliative Care referral or Hospice referral   Increase activity slowly   Complete by: As directed      Allergies as of 10/24/2018   No Known Allergies     Medication List    STOP taking these medications   furosemide 20 MG tablet Commonly known as: LASIX   metFORMIN 500 MG tablet Commonly known as: GLUCOPHAGE   QUEtiapine 50 MG tablet Commonly known as: SEROQUEL     TAKE these medications   allopurinol 300 MG tablet Commonly known as: ZYLOPRIM Take 300 mg by  mouth every morning.   amLODipine 2.5 MG tablet Commonly known as: NORVASC Take 2.5 mg by mouth every morning.   aspirin EC 81 MG tablet Take 81 mg by mouth every morning.   hydrALAZINE 50 MG tablet Commonly known as: APRESOLINE Take 50 mg by mouth 2 (two) times daily. Hold for SBP <100   losartan 25 MG tablet Commonly known as: COZAAR Take 25 mg by mouth every morning.   magnesium oxide 400 MG tablet Commonly known as: MAG-OX Take 400 mg by mouth every  morning.   metoprolol succinate 25 MG 24 hr tablet Commonly known as: TOPROL-XL Take 25 mg by mouth every morning.   omeprazole 40 MG capsule Commonly known as: PRILOSEC Take 40 mg by mouth every morning.   pravastatin 10 MG tablet Commonly known as: PRAVACHOL Take 10 mg by mouth at bedtime.   vitamin B-12 250 MCG tablet Commonly known as: CYANOCOBALAMIN Take 250 mcg by mouth every morning.   VITAMIN D3 PO Take 1 tablet by mouth every morning.      Contact information for after-discharge care    Destination    [facility] Preferred SNF .   Service: Skilled Nursing Contact information: [address] [place] [phone]             No Known Allergies  Consultations:  Neurology   Palliative Care   Social Work   SLP   Procedures/Studies: Mr Angio Head Wo Contrast  Result Date: 10/15/2018 CLINICAL DATA:  Encephalopathy EXAM: MRI HEAD WITHOUT CONTRAST MRA HEAD WITHOUT CONTRAST TECHNIQUE: Multiplanar, multiecho pulse sequences of the brain and surrounding structures were obtained without intravenous contrast. Angiographic images of the head were obtained using MRA technique without contrast. COMPARISON:  Head CT 10/14/2018 FINDINGS: MRI HEAD FINDINGS BRAIN: There is no acute infarct, acute hemorrhage or extra-axial collection. Early confluent hyperintense T2-weighted signal of the periventricular and deep white matter, most commonly due to chronic ischemic microangiopathy. Generalized advanced atrophy. No hydrocephalus. The midline structures are normal. There are multiple old small vessel infarcts. VASCULAR: The major intracranial arterial and venous sinus flow voids are normal. Susceptibility weighted imaging shows multiple old microhemorrhages as well as sequelae of prior hemorrhagic events in the deep gray nuclei. SKULL AND UPPER CERVICAL SPINE: Calvarial bone marrow signal is normal. There is no skull base mass. The  visualized upper cervical spine and soft tissues are normal. SINUSES/ORBITS: There are no fluid levels or advanced mucosal thickening. The mastoid air cells and middle ear cavities are free of fluid. The orbits are normal. MRA HEAD FINDINGS POSTERIOR CIRCULATION: --Vertebral arteries: Normal V4 segments. --Posterior inferior cerebellar arteries (PICA): Patent origins from the vertebral arteries. --Anterior inferior cerebellar arteries (AICA): Patent origins from the basilar artery. --Basilar artery: Normal. --Superior cerebellar arteries: Normal. --Posterior cerebral arteries: Normal. The left PCA is partially supplied by a posterior communicating artery (p-comm). ANTERIOR CIRCULATION: --Intracranial internal carotid arteries: Normal. --Anterior cerebral arteries (ACA): Normal. Both A1 segments are present. Patent anterior communicating artery (a-comm). --Middle cerebral arteries (MCA): Normal. IMPRESSION: 1. No acute intracranial abnormality. 2. Generalized advanced atrophy and chronic ischemic microangiopathy. 3. Multiple old infarcts and chronic microhemorrhages. 4. Normal MRA of the Circle of Willis. Electronically Signed   By: Ulyses Jarred M.D.   On: 10/15/2018 01:20   Mr Brain Wo Contrast  Result Date: 10/15/2018 CLINICAL DATA:  Encephalopathy EXAM: MRI HEAD WITHOUT CONTRAST MRA HEAD WITHOUT CONTRAST TECHNIQUE: Multiplanar, multiecho pulse sequences of the brain and surrounding  structures were obtained without intravenous contrast. Angiographic images of the head were obtained using MRA technique without contrast. COMPARISON:  Head CT 10/14/2018 FINDINGS: MRI HEAD FINDINGS BRAIN: There is no acute infarct, acute hemorrhage or extra-axial collection. Early confluent hyperintense T2-weighted signal of the periventricular and deep white matter, most commonly due to chronic ischemic microangiopathy. Generalized advanced atrophy. No hydrocephalus. The midline structures are normal. There are multiple old small  vessel infarcts. VASCULAR: The major intracranial arterial and venous sinus flow voids are normal. Susceptibility weighted imaging shows multiple old microhemorrhages as well as sequelae of prior hemorrhagic events in the deep gray nuclei. SKULL AND UPPER CERVICAL SPINE: Calvarial bone marrow signal is normal. There is no skull base mass. The visualized upper cervical spine and soft tissues are normal. SINUSES/ORBITS: There are no fluid levels or advanced mucosal thickening. The mastoid air cells and middle ear cavities are free of fluid. The orbits are normal. MRA HEAD FINDINGS POSTERIOR CIRCULATION: --Vertebral arteries: Normal V4 segments. --Posterior inferior cerebellar arteries (PICA): Patent origins from the vertebral arteries. --Anterior inferior cerebellar arteries (AICA): Patent origins from the basilar artery. --Basilar artery: Normal. --Superior cerebellar arteries: Normal. --Posterior cerebral arteries: Normal. The left PCA is partially supplied by a posterior communicating artery (p-comm). ANTERIOR CIRCULATION: --Intracranial internal carotid arteries: Normal. --Anterior cerebral arteries (ACA): Normal. Both A1 segments are present. Patent anterior communicating artery (a-comm). --Middle cerebral arteries (MCA): Normal. IMPRESSION: 1. No acute intracranial abnormality. 2. Generalized advanced atrophy and chronic ischemic microangiopathy. 3. Multiple old infarcts and chronic microhemorrhages. 4. Normal MRA of the Circle of Willis. Electronically Signed   By: Ulyses Jarred M.D.   On: 10/15/2018 01:20   Dg Chest Port 1 View  Result Date: 10/14/2018 CLINICAL DATA:  84 year old male with history of weakness. Left-sided neglect and left facial droop. EXAM: PORTABLE CHEST 1 VIEW COMPARISON:  Chest x-ray 07/24/2018. FINDINGS: Lung volumes are low. No consolidative airspace disease. No pleural effusions. No pneumothorax. No pulmonary nodule or mass noted. Pulmonary vasculature and the cardiomediastinal  silhouette are within normal limits. IMPRESSION: 1. Low lung volumes without radiographic evidence acute cardiopulmonary disease. Electronically Signed   By: Vinnie Langton M.D.   On: 10/14/2018 20:44   Dg Swallowing Func-speech Pathology  Result Date: 10/17/2018 Objective Swallowing Evaluation: Type of Study: MBS-Modified Barium Swallow Study  Patient Details Name: Mason Webb. MRN: [id number] Date of Birth: 10-Feb-1934 Today's Date: 10/17/2018 Time: SLP Start Time (ACUTE ONLY): 2979 -SLP Stop Time (ACUTE ONLY): 0859 SLP Time Calculation (min) (ACUTE ONLY): 24 min Past Medical History: Past Medical History: Diagnosis Date . CAD (coronary artery disease)  . Hemorrhagic stroke ([place])  . Hyperlipidemia  . Hypertension  Past Surgical History: Past Surgical History: Procedure Laterality Date . PEG PLACEMENT   HPI: 84 yo male adm to [facility] with AMS - MRI negative except for old gangionic, left thalamic and caudate cva.  Pt cxr showed low lung volumes.  Pt reportedly resides with his wife.  He choked on pills. Per chart review, pt has h/o PEG tube placement- ? indication?  Subjective: pt awake in chair Assessment / Plan / Recommendation CHL IP CLINICAL IMPRESSIONS 10/17/2018 Clinical Impression The patient presents with moderate oral dysphagia characterized by delayed oral transit, lingual pumping and premature spill.  Severe Pharyngeal dysphagia with decreased epiglottic deflection, decreased hyo-laryngeal excursion, little to no pharyngeal constriction resulting in gross vallecula and moderate pyriform residue (that mix with secretions). The patient was observed to swallow multiple times inconsistently in attempt to clear  the pharyngeal residue and generally was not successful. Head turn right did not decrease accumulation in pharynx. Cued "Hock" was not adequate and thus no residual was removed.  Cricopharyngeal opening appeared to be inadequate.  Pt with gross retention (mixed with secretions) in phayrnx without  consistent sensation nor ability to clear.  Laryngeal penetration noted with all liquids with pt demonstrating reflexive throat clear but this did not successfully clear material from the laryngeal vestibule.  Given h/o dysphagia requiring PEG placement, suspect this is an exacerbation of baseline difficulties. Given pt's advanced age, dementia and acute on chronic dysphagia (dating back to at least 2016) - recommend to consider a palliative consult to establish goals of care.  Pt admits he is losing weight and has been coughing with intake prior to admit.  Rec npo except tsps water and ice chips at this time. Marland Kitchen SLP Visit Diagnosis Dysphagia, oropharyngeal phase (R13.12);Dysphagia, pharyngoesophageal phase (R13.14) Attention and concentration deficit following -- Frontal lobe and executive function deficit following -- Impact on safety and function Severe aspiration risk;Risk for inadequate nutrition/hydration   CHL IP TREATMENT RECOMMENDATION 10/17/2018 Treatment Recommendations Therapy as outlined in treatment plan below   Prognosis 10/17/2018 Prognosis for Safe Diet Advancement Guarded Barriers to Reach Goals Severity of deficits;Other (Comment) Barriers/Prognosis Comment premorbid deficits dating back to at least 2016 - pt underwent mbs 2017 with recommendation for npo CHL IP DIET RECOMMENDATION 10/17/2018 SLP Diet Recommendations NPO;Ice chips PRN after oral care Liquid Administration via Spoon Medication Administration Via alternative means Compensations Multiple dry swallows after each bite/sip Postural Changes --   No flowsheet data found.  CHL IP FOLLOW UP RECOMMENDATIONS 10/17/2018 Follow up Recommendations (No Data)   CHL IP FREQUENCY AND DURATION 10/17/2018 Speech Therapy Frequency (ACUTE ONLY) min 2x/week Treatment Duration 1 week      CHL IP ORAL PHASE 10/17/2018 Oral Phase Impaired Oral - Pudding Teaspoon -- Oral - Pudding Cup -- Oral - Honey Teaspoon -- Oral - Honey Cup -- Oral - Nectar Teaspoon Delayed  oral transit;Weak lingual manipulation Oral - Nectar Cup Delayed oral transit;Weak lingual manipulation Oral - Nectar Straw Weak lingual manipulation;Delayed oral transit Oral - Thin Teaspoon Weak lingual manipulation;Delayed oral transit Oral - Thin Cup Weak lingual manipulation;Delayed oral transit Oral - Thin Straw Weak lingual manipulation;Delayed oral transit Oral - Puree Weak lingual manipulation;Delayed oral transit;Lingual pumping Oral - Mech Soft NT Oral - Regular -- Oral - Multi-Consistency -- Oral - Pill NT Oral Phase - Comment --  CHL IP PHARYNGEAL PHASE 10/17/2018 Pharyngeal Phase Impaired Pharyngeal- Pudding Teaspoon -- Pharyngeal -- Pharyngeal- Pudding Cup -- Pharyngeal -- Pharyngeal- Honey Teaspoon -- Pharyngeal -- Pharyngeal- Honey Cup -- Pharyngeal -- Pharyngeal- Nectar Teaspoon Reduced laryngeal elevation;Reduced anterior laryngeal mobility;Reduced airway/laryngeal closure;Reduced epiglottic inversion;Pharyngeal residue - valleculae;Pharyngeal residue - pyriform;Reduced pharyngeal peristalsis Pharyngeal Material does not enter airway Pharyngeal- Nectar Cup Reduced epiglottic inversion;Reduced anterior laryngeal mobility;Reduced laryngeal elevation;Reduced airway/laryngeal closure;Reduced tongue base retraction;Pharyngeal residue - valleculae;Pharyngeal residue - pyriform;Reduced pharyngeal peristalsis Pharyngeal Material does not enter airway Pharyngeal- Nectar Straw Reduced laryngeal elevation;Reduced anterior laryngeal mobility;Reduced epiglottic inversion;Reduced pharyngeal peristalsis;Reduced airway/laryngeal closure;Pharyngeal residue - valleculae;Pharyngeal residue - pyriform;Reduced tongue base retraction Pharyngeal Material does not enter airway Pharyngeal- Thin Teaspoon Reduced epiglottic inversion;Reduced anterior laryngeal mobility;Reduced laryngeal elevation;Reduced airway/laryngeal closure;Reduced tongue base retraction;Penetration/Aspiration during swallow;Pharyngeal residue -  valleculae;Pharyngeal residue - pyriform;Reduced pharyngeal peristalsis Pharyngeal Material enters airway, remains ABOVE vocal cords and not ejected out Pharyngeal- Thin Cup Reduced pharyngeal peristalsis;Reduced epiglottic inversion;Reduced anterior laryngeal mobility;Reduced airway/laryngeal closure;Reduced tongue base  retraction;Reduced laryngeal elevation;Pharyngeal residue - valleculae;Pharyngeal residue - pyriform;Penetration/Aspiration during swallow;Penetration/Apiration after swallow Pharyngeal Material enters airway, CONTACTS cords and not ejected out Pharyngeal- Thin Straw Reduced epiglottic inversion;Reduced anterior laryngeal mobility;Reduced laryngeal elevation;Reduced airway/laryngeal closure;Reduced tongue base retraction;Pharyngeal residue - valleculae;Pharyngeal residue - pyriform Pharyngeal Material enters airway, passes BELOW cords and not ejected out despite cough attempt by patient Pharyngeal- Puree Reduced epiglottic inversion;Reduced anterior laryngeal mobility;Reduced laryngeal elevation;Reduced airway/laryngeal closure;Reduced tongue base retraction;Pharyngeal residue - valleculae;Reduced pharyngeal peristalsis Pharyngeal Material does not enter airway Pharyngeal- Mechanical Soft NT Pharyngeal -- Pharyngeal- Regular -- Pharyngeal -- Pharyngeal- Multi-consistency -- Pharyngeal -- Pharyngeal- Pill NT Pharyngeal -- Pharyngeal Comment --  CHL IP CERVICAL ESOPHAGEAL PHASE 10/17/2018 Cervical Esophageal Phase Impaired Pudding Teaspoon -- Pudding Cup -- Honey Teaspoon -- Honey Cup -- Nectar Teaspoon Reduced cricopharyngeal relaxation Nectar Cup Reduced cricopharyngeal relaxation Nectar Straw Reduced cricopharyngeal relaxation Thin Teaspoon Reduced cricopharyngeal relaxation Thin Cup Reduced cricopharyngeal relaxation Thin Straw Reduced cricopharyngeal relaxation Puree Reduced cricopharyngeal relaxation Mechanical Soft -- Regular -- Multi-consistency -- Pill -- Cervical Esophageal Comment --  Macario Golds 10/17/2018, 9:24 AM Luanna Salk, MS [facility] SLP Acute Rehab Services Pager [phone] Office [phone]              Ct Head Code Stroke Wo Contrast  Result Date: 10/14/2018 CLINICAL DATA:  Code stroke. Focal neuro deficit, less than 6 hours, stroke suspected. Additional history provided: Left-sided neglect, left facial droop. EXAM: CT HEAD WITHOUT CONTRAST TECHNIQUE: Contiguous axial images were obtained from the base of the skull through the vertex without intravenous contrast. COMPARISON:  No pertinent prior studies available for comparison. FINDINGS: Brain: No evidence of acute intracranial hemorrhage. No acute demarcated cortical infarction identified. Ill-defined hypoattenuation of the cerebral white matter is nonspecific, but consistent with chronic small vessel ischemic disease. Chronic appearing lacunar infarcts within the left corona radiata/internal capsule and within the left thalamus. Age-indeterminate lacunar infarct within the left caudate nucleus. No evidence of intracranial mass. No midline shift or extra-axial fluid collection. Moderate generalized parenchymal atrophy. Vascular: No hyperdense vessel. Skull: No calvarial fracture Sinuses/Orbits: Visualized orbits demonstrate no acute abnormality. Mucosal thickening within the partially imaged left maxillary sinus. Trace left mastoid effusion. ASPECTS [facility] Stroke Program Early CT Score) - Ganglionic level infarction (caudate, lentiform nuclei, internal capsule, insula, M1-M3 cortex): 7 - Supraganglionic infarction (M4-M6 cortex): 3 Total score (0-10 with 10 being normal): 10 (in the right MCA vascular territory). These results were called by telephone at the time of interpretation on 10/14/2018 at 5:57 pm to provider Dr. Erlinda Hong, who verbally acknowledged these results. IMPRESSION: No evidence of intracranial hemorrhage or acute demarcated cortical infarction. Age-indeterminate lacunar infarct within the left caudate. Chronic  lacunar infarcts within the left corona radiata/internal capsule and left thalamus. Generalized parenchymal atrophy with chronic small vessel ischemic disease. Electronically Signed   By: Kellie Simmering   On: 10/14/2018 18:07      Subjective: No acute events.  VSS.    Discharge Exam: Vitals:   10/23/18 2340 10/24/18 0737  BP: (!) 156/95 (!) 153/98  Pulse: 72 74  Resp: 17 16  Temp: 98.6 F (37 C) 97.8 F (36.6 C)  SpO2: 97% 99%   Vitals:   10/23/18 0846 10/23/18 1145 10/23/18 2340 10/24/18 0737  BP: (!) 158/95 139/90 (!) 156/95 (!) 153/98  Pulse: 72 76 72 74  Resp: 14 16 17 16   Temp: 97.6 F (36.4 C) 97.7 F (36.5 C) 98.6 F (37 C) 97.8 F (36.6 C)  TempSrc: Oral Oral  Oral Oral  SpO2: 99% 99% 97% 99%    General: Pt is alert, sleeping but rousable, not in acute distress, makes eye contact Cardiovascular: RRR, nl S1-S2, no murmurs appreciated.   No LE edema.   Respiratory: Normal respiratory rate and rhythm.  CTAB without rales or wheezes. Abdominal: Abdomen soft and non-tender.  No distension or HSM.   Neuro/Psych: Strength symmetrically weak in upper and lower extremities. Generalized weakness, slowed mentation, but no focal deficits.  Pleasant / calm.     The results of significant diagnostics from this hospitalization (including imaging, microbiology, ancillary and laboratory) are listed below for reference.     Microbiology: Recent Results (from the past 240 hour(s))  SARS Coronavirus 2 [facility] order, Performed in [facility] hospital lab) Nasopharyngeal Nasopharyngeal Swab     Status: None   Collection Time: 10/14/18  6:55 PM   Specimen: Nasopharyngeal Swab  Result Value Ref Range Status   SARS Coronavirus 2 NEGATIVE NEGATIVE Final    Comment: (NOTE) If result is NEGATIVE SARS-CoV-2 target nucleic acids are NOT DETECTED. The SARS-CoV-2 RNA is generally detectable in upper and lower  respiratory specimens during the acute phase of infection. The lowest   concentration of SARS-CoV-2 viral copies this assay can detect is 250  copies / mL. A negative result does not preclude SARS-CoV-2 infection  and should not be used as the sole basis for treatment or other  patient management decisions.  A negative result may occur with  improper specimen collection / handling, submission of specimen other  than nasopharyngeal swab, presence of viral mutation(s) within the  areas targeted by this assay, and inadequate number of viral copies  (<250 copies / mL). A negative result must be combined with clinical  observations, patient history, and epidemiological information. If result is POSITIVE SARS-CoV-2 target nucleic acids are DETECTED. The SARS-CoV-2 RNA is generally detectable in upper and lower  respiratory specimens dur ing the acute phase of infection.  Positive  results are indicative of active infection with SARS-CoV-2.  Clinical  correlation with patient history and other diagnostic information is  necessary to determine patient infection status.  Positive results do  not rule out bacterial infection or co-infection with other viruses. If result is PRESUMPTIVE POSTIVE SARS-CoV-2 nucleic acids MAY BE PRESENT.   A presumptive positive result was obtained on the submitted specimen  and confirmed on repeat testing.  While 2019 novel coronavirus  (SARS-CoV-2) nucleic acids may be present in the submitted sample  additional confirmatory testing may be necessary for epidemiological  and / or clinical management purposes  to differentiate between  SARS-CoV-2 and other Sarbecovirus currently known to infect humans.  If clinically indicated additional testing with an alternate test  methodology [phone]) is advised. The SARS-CoV-2 RNA is generally  detectable in upper and lower respiratory sp ecimens during the acute  phase of infection. The expected result is Negative. Fact Sheet for Patients:  StrictlyIdeas.no Fact Sheet  for Healthcare Providers: BankingDealers.co.za This test is not yet approved or cleared by the [place] FDA and has been authorized for detection and/or diagnosis of SARS-CoV-2 by FDA under an Emergency Use Authorization (EUA).  This EUA will remain in effect (meaning this test can be used) for the duration of the COVID-19 declaration under Section 564(b)(1) of the Act, 21 U.S.C. section 360bbb-3(b)(1), unless the authorization is terminated or revoked sooner. Performed at [facility], [place] [address], [place], [place] [address]   Blood Culture (routine x 2)  Status: None   Collection Time: 10/14/18  7:50 PM   Specimen: BLOOD  Result Value Ref Range Status   Specimen Description BLOOD RIGHT NECK  Final   Special Requests   Final    BOTTLES DRAWN AEROBIC AND ANAEROBIC Blood Culture results may not be optimal due to an inadequate volume of blood received in culture bottles   Culture   Final    NO GROWTH 5 DAYS Performed at [facility], [place] [address], [place], [place] [address]    Report Status 10/19/2018 FINAL  Final  Urine culture     Status: Abnormal   Collection Time: 10/14/18  9:05 PM   Specimen: In/Out Cath Urine  Result Value Ref Range Status   Specimen Description IN/OUT CATH URINE  Final   Special Requests   Final    NONE Performed at [facility], [place] [address] [place], [place] [address]    Culture >=100,000 COLONIES/mL PROTEUS MIRABILIS (A)  Final   Report Status 10/17/2018 FINAL  Final   Organism ID, Bacteria PROTEUS MIRABILIS (A)  Final      Susceptibility   Proteus mirabilis - MIC*    AMPICILLIN <=2 SENSITIVE Sensitive     CEFAZOLIN <=4 SENSITIVE Sensitive     CEFTRIAXONE <=1 SENSITIVE Sensitive     CIPROFLOXACIN <=0.25 SENSITIVE Sensitive     GENTAMICIN <=1 SENSITIVE Sensitive     IMIPENEM 2 SENSITIVE Sensitive     NITROFURANTOIN 256 RESISTANT Resistant     TRIMETH/SULFA <=20 SENSITIVE Sensitive      AMPICILLIN/SULBACTAM <=2 SENSITIVE Sensitive     PIP/TAZO <=4 SENSITIVE Sensitive     * >=100,000 COLONIES/mL PROTEUS MIRABILIS  Novel Coronavirus, NAA (hospital order; send-out to ref lab)     Status: None   Collection Time: 10/22/18  4:26 PM   Specimen: Nasopharyngeal Swab; Respiratory  Result Value Ref Range Status   SARS-CoV-2, NAA NOT DETECTED NOT DETECTED Final    Comment: (NOTE) This nucleic acid amplification test was developed and its performance characteristics determined by [employer]. Nucleic acid amplification tests include PCR and TMA. This test has not been FDA cleared or approved. This test has been authorized by FDA under an Emergency Use Authorization (EUA). This test is only authorized for the duration of time the declaration that circumstances exist justifying the authorization of the emergency use of in vitro diagnostic tests for detection of SARS-CoV-2 virus and/or diagnosis of COVID-19 infection under section 564(b)(1) of the Act, 21 U.S.C. 209OBS-9(G) (1), unless the authorization is terminated or revoked sooner. When diagnostic testing is negative, the possibility of a false negative result should be considered in the context of a patient's recent exposures and the presence of clinical signs and symptoms consistent with COVID-19. An individual without symptoms of COVID- 19 and who is not shedding SARS-CoV-2 vi rus would expect to have a negative (not detected) result in this assay. Performed At: [facility] [address] [place], [place] [id number] Rush Farmer MD ML:[id number]    [place]  Final    Comment: Performed at [facility], [place] [address] [place], [place] [address]     Labs: BNP (last 3 results) No results for input(s): BNP in the last 8760 hours. Basic Metabolic Panel: No results for input(s): NA, K, CL, CO2, GLUCOSE, BUN, CREATININE, CALCIUM, MG, PHOS in the last 168 hours. Liver  Function Tests: No results for input(s): AST, ALT, ALKPHOS, BILITOT, PROT, ALBUMIN in the last 168 hours.  No results for input(s): LIPASE, AMYLASE in the last 168 hours. No results for input(s): AMMONIA in the last 168 hours. CBC: No results for input(s): WBC, NEUTROABS, HGB, HCT, MCV, PLT in the last 168 hours. Cardiac Enzymes: No results for input(s): CKTOTAL, CKMB, CKMBINDEX, TROPONINI in the last 168 hours. BNP: Invalid input(s): POCBNP CBG: Recent Labs  Lab 10/17/18 1227 10/17/18 1651 10/18/18 1635  GLUCAP 53* 95 144*   D-Dimer No results for input(s): DDIMER in the last 72 hours. Hgb A1c No results for input(s): HGBA1C in the last 72 hours. Lipid Profile No results for input(s): CHOL, HDL, LDLCALC, TRIG, CHOLHDL, LDLDIRECT in the last 72 hours. Thyroid function studies No results for input(s): TSH, T4TOTAL, T3FREE, THYROIDAB in the last 72 hours.  Invalid input(s): FREET3 Anemia work up No results for input(s): VITAMINB12, FOLATE, FERRITIN, TIBC, IRON, RETICCTPCT in the last 72 hours. Urinalysis    Component Value Date/Time   COLORURINE YELLOW 10/14/2018 2105   APPEARANCEUR CLOUDY (A) 10/14/2018 2105   LABSPEC 1.016 10/14/2018 2105   PHURINE 8.0 10/14/2018 2105   GLUCOSEU NEGATIVE 10/14/2018 2105   HGBUR NEGATIVE 10/14/2018 2105   BILIRUBINUR NEGATIVE 10/14/2018 2105   [place] NEGATIVE 10/14/2018 2105   PROTEINUR 30 (A) 10/14/2018 2105   NITRITE POSITIVE (A) 10/14/2018 2105   LEUKOCYTESUR LARGE (A) 10/14/2018 2105   Sepsis Labs Invalid input(s): PROCALCITONIN,  WBC,  LACTICIDVEN Microbiology Recent Results (from the past 240 hour(s))  SARS Coronavirus 2 [facility] order, Performed in [facility] hospital lab) Nasopharyngeal Nasopharyngeal Swab     Status: None   Collection Time: 10/14/18  6:55 PM   Specimen: Nasopharyngeal Swab  Result Value Ref Range Status   SARS Coronavirus 2 NEGATIVE NEGATIVE Final    Comment: (NOTE) If result is NEGATIVE SARS-CoV-2  target nucleic acids are NOT DETECTED. The SARS-CoV-2 RNA is generally detectable in upper and lower  respiratory specimens during the acute phase of infection. The lowest  concentration of SARS-CoV-2 viral copies this assay can detect is 250  copies / mL. A negative result does not preclude SARS-CoV-2 infection  and should not be used as the sole basis for treatment or other  patient management decisions.  A negative result may occur with  improper specimen collection / handling, submission of specimen other  than nasopharyngeal swab, presence of viral mutation(s) within the  areas targeted by this assay, and inadequate number of viral copies  (<250 copies / mL). A negative result must be combined with clinical  observations, patient history, and epidemiological information. If result is POSITIVE SARS-CoV-2 target nucleic acids are DETECTED. The SARS-CoV-2 RNA is generally detectable in upper and lower  respiratory specimens dur ing the acute phase of infection.  Positive  results are indicative of active infection with SARS-CoV-2.  Clinical  correlation with patient history and other diagnostic information is  necessary to determine patient infection status.  Positive results do  not rule out bacterial infection or co-infection with other viruses. If result is PRESUMPTIVE POSTIVE SARS-CoV-2 nucleic acids MAY BE PRESENT.   A presumptive positive result was obtained on the submitted specimen  and confirmed on repeat testing.  While 2019 novel coronavirus  (SARS-CoV-2) nucleic acids may be present in the submitted sample  additional confirmatory testing may be necessary for epidemiological  and / or clinical management purposes  to differentiate between  SARS-CoV-2 and other Sarbecovirus currently known to infect humans.  If clinically indicated additional testing with an alternate test  methodology [id number]) is advised.  The SARS-CoV-2 RNA is generally  detectable in upper and lower  respiratory sp ecimens during the acute  phase of infection. The expected result is Negative. Fact Sheet for Patients:  StrictlyIdeas.no Fact Sheet for Healthcare Providers: BankingDealers.co.za This test is not yet approved or cleared by the [place] FDA and has been authorized for detection and/or diagnosis of SARS-CoV-2 by FDA under an Emergency Use Authorization (EUA).  This EUA will remain in effect (meaning this test can be used) for the duration of the COVID-19 declaration under Section 564(b)(1) of the Act, 21 U.S.C. section 360bbb-3(b)(1), unless the authorization is terminated or revoked sooner. Performed at [facility], [place] [address] [place], [place] [address]   Blood Culture (routine x 2)     Status: None   Collection Time: 10/14/18  7:50 PM   Specimen: BLOOD  Result Value Ref Range Status   Specimen Description BLOOD RIGHT NECK  Final   Special Requests   Final    BOTTLES DRAWN AEROBIC AND ANAEROBIC Blood Culture results may not be optimal due to an inadequate volume of blood received in culture bottles   Culture   Final    NO GROWTH 5 DAYS Performed at [facility], [place] [address], [place], [place] [address]    Report Status 10/19/2018 FINAL  Final  Urine culture     Status: Abnormal   Collection Time: 10/14/18  9:05 PM   Specimen: In/Out Cath Urine  Result Value Ref Range Status   Specimen Description IN/OUT CATH URINE  Final   Special Requests   Final    NONE Performed at [facility], [place] [address] [place], [place] [address]    Culture >=100,000 COLONIES/mL PROTEUS MIRABILIS (A)  Final   Report Status 10/17/2018 FINAL  Final   Organism ID, Bacteria PROTEUS MIRABILIS (A)  Final      Susceptibility   Proteus mirabilis - MIC*    AMPICILLIN <=2 SENSITIVE Sensitive     CEFAZOLIN <=4 SENSITIVE Sensitive     CEFTRIAXONE <=1 SENSITIVE Sensitive     CIPROFLOXACIN <=0.25 SENSITIVE Sensitive      GENTAMICIN <=1 SENSITIVE Sensitive     IMIPENEM 2 SENSITIVE Sensitive     NITROFURANTOIN 256 RESISTANT Resistant     TRIMETH/SULFA <=20 SENSITIVE Sensitive     AMPICILLIN/SULBACTAM <=2 SENSITIVE Sensitive     PIP/TAZO <=4 SENSITIVE Sensitive     * >=100,000 COLONIES/mL PROTEUS MIRABILIS  Novel Coronavirus, NAA (hospital order; send-out to ref lab)     Status: None   Collection Time: 10/22/18  4:26 PM   Specimen: Nasopharyngeal Swab; Respiratory  Result Value Ref Range Status   SARS-CoV-2, NAA NOT DETECTED NOT DETECTED Final    Comment: (NOTE) This nucleic acid amplification test was developed and its performance characteristics determined by [employer]. Nucleic acid amplification tests include PCR and TMA. This test has not been FDA cleared or approved. This test has been authorized by FDA under an Emergency Use Authorization (EUA). This test is only authorized for the duration of time the declaration that circumstances exist justifying the authorization of the emergency use of in vitro diagnostic tests for detection of SARS-CoV-2 virus and/or diagnosis of COVID-19 infection under section 564(b)(1) of the Act, 21 U.S.C. 562BWL-8(L) (1), unless the authorization is terminated or revoked sooner. When diagnostic testing is negative, the possibility of a false negative result should be considered in the context of a patient's recent exposures and the presence of clinical signs  and symptoms consistent with COVID-19. An individual without symptoms of COVID- 19 and who is not shedding SARS-CoV-2 vi rus would expect to have a negative (not detected) result in this assay. Performed At: [facility] [address] [place], [place] [id number] Rush Farmer MD SX:[id number]    [place]  Final    Comment: Performed at [facility], [place] [address], [place], [place] [address]     Time coordinating discharge: 25  minutes      SIGNED:  Myrene Buddy, MD [facility] 10/24/2018, 12:16 PM

## 2018-10-24 NOTE — TOC Transition Note (Signed)
Transition of Care [facility]) - CM/SW Discharge Note   Patient Details  Name: Estaban [place]. MRN: [id number] Date of Birth: September 22, 1934  [facility] of [facility]) CM/SW Contact:  Geralynn Ochs, LCSW Phone Number: 10/24/2018, 5:05 PM   Clinical Narrative:   Nurse to call report to [phone], Room 505    Final next level of care: Skilled Nursing Facility Barriers to Discharge: Barriers Resolved   Patient Goals and CMS Choice   CMS Medicare.gov Compare Post Acute Care list provided to:: Patient Represenative (must comment)(pt's son, POA) Choice offered to / list presented to : Adult Children(via telephone)  Discharge Placement              Patient chooses bed at: [facility] Patient to be transferred to facility by: [facility]   Patient and family notified of of transfer: 10/24/18  Discharge Plan and Services In-house Referral: Clinical Social Work   Post Acute Care Choice: [place]: NA          Social Determinants of Health (SDOH) Interventions     Readmission Risk Interventions No flowsheet data found.

## 2018-10-25 ENCOUNTER — Non-Acute Institutional Stay (SKILLED_NURSING_FACILITY): Payer: Medicare Other | Admitting: Internal Medicine

## 2018-10-25 ENCOUNTER — Encounter: Payer: Self-pay | Admitting: Internal Medicine

## 2018-10-25 ENCOUNTER — Telehealth: Payer: Self-pay | Admitting: *Deleted

## 2018-10-25 DIAGNOSIS — N390 Urinary tract infection, site not specified: Secondary | ICD-10-CM | POA: Diagnosis not present

## 2018-10-25 DIAGNOSIS — I10 Essential (primary) hypertension: Secondary | ICD-10-CM | POA: Diagnosis not present

## 2018-10-25 DIAGNOSIS — G9340 Encephalopathy, unspecified: Secondary | ICD-10-CM | POA: Diagnosis not present

## 2018-10-25 DIAGNOSIS — I4891 Unspecified atrial fibrillation: Secondary | ICD-10-CM

## 2018-10-25 DIAGNOSIS — R627 Adult failure to thrive: Secondary | ICD-10-CM | POA: Diagnosis not present

## 2018-10-25 NOTE — Progress Notes (Signed)
Location:  [facility] Room Number: 505-P Place of Service:  SNF [phone]) Provider:  Edmon Crape, PA-C  Zoila Shutter, MD  Patient Care Team: Zoila Shutter, MD as PCP - General (Internal Medicine)  Extended Emergency Contact Information Primary Emergency Contact: Era Bumpers Mobile Phone: [phone] Relation: Daughter Secondary Emergency Contact: seegars, tamica Mobile Phone: [phone] Relation: Daughter  Code Status:  DNR Goals of care: Advanced Directive information Advanced Directives 10/14/2018  Does Patient Have a Medical Advance Directive? No  Would patient like information on creating a medical advance directive? No - Patient declined     Chief Complaint  Patient presents with   Hospitalization Follow-up    Patient seen for admission to [facility], S/P hospitalization at [facility] 10/3-10/12/20 for altered mental status.    HPI:  Pt is an 84 y.o. male seen today for an acute visit for hospital followup for increased confusion-.  He has a history of advanced dementia and has been living out of all as well as a history of CHF as well as a history of a hemorrhagic stroke-atrial fibrillation not on anticoagulation because of his fall risk as well as history of hypertension esophagitis type 2 diabetes and coronary artery disease.  Presented the hospital with altered mental status confusion and a possible left facial droop and left upper extremity neglect.  Was noted to be febrile with a temperature of 100.3.  There were initial concerns for CVA however MRI was negative for this-he was found to have a Proteus UTI with dehydration and underlying dementia contributing to this.  His Seroquel was stopped because of sleepiness.  Apparently he was treated with Rocephin for the UTI as well as IV fluids and his cognition returned to baseline.  Patient does have a history of chronic dysphagia and apparently at one point had a PEG tube which was  removed-he tolerated a soft diet in the hospital palliative care did discuss replacing the PEG with the family-the family felt this was not listed in the patient's wishes-aspiration risk was known but patient's wishes would be for oral feeding over further enteral nutrition.  In regards to hypertension this apparently was stable in the hospital on hydralazine Cozaar and Toprol and Norvasc.  Goals of care were discussed with the family with desire for DNR status with no aggressive measures do continue supportive care.  Apparently family is seeking to have patient appointed a guardian ad litem with the state  Currently he is resting in bed comfortably vital signs appear to be stable he does have significant confusion but is pleasant and does follow some simple verbal commands     Past Medical History:  Diagnosis Date   CAD (coronary artery disease)    Hemorrhagic stroke (HCC)    Hyperlipidemia    Hypertension    Past Surgical History:  Procedure Laterality Date   PEG PLACEMENT      No Known Allergies  Outpatient Encounter Medications as of 10/25/2018  Medication Sig   allopurinol (ZYLOPRIM) 300 MG tablet Take 300 mg by mouth every morning.   amLODipine (NORVASC) 2.5 MG tablet Take 2.5 mg by mouth every morning.   aspirin EC 81 MG tablet Take 81 mg by mouth every morning.   Cholecalciferol (VITAMIN D3 PO) Take 1,000 Units by mouth every morning.    hydrALAZINE (APRESOLINE) 50 MG tablet Take 50 mg by mouth 2 (two) times daily. Hold for SBP <100   losartan (COZAAR) 25 MG tablet Take 25 mg by  mouth every morning.   magnesium oxide (MAG-OX) 400 MG tablet Take 400 mg by mouth every morning.   metoprolol succinate (TOPROL-XL) 25 MG 24 hr tablet Take 25 mg by mouth every morning.   omeprazole (PRILOSEC) 40 MG capsule Take 40 mg by mouth every morning.   pravastatin (PRAVACHOL) 10 MG tablet Take 10 mg by mouth at bedtime.   vitamin B-12 (CYANOCOBALAMIN) 250 MCG tablet Take  250 mcg by mouth every morning.   No facility-administered encounter medications on file as of 10/25/2018.     Review of Systems   This is essentially unobtainable secondary to patient having significant dementia-nursing does not report any issues that he did eat and drink some this morning.  When I asked him if he is hurting he says no.    Immunization History  Administered Date(s) Administered   Influenza, High Dose Seasonal PF 10/14/2015, 09/21/2016   Influenza,inj,Quad PF,6+ Mos 09/01/2017   Influenza,trivalent, recombinat, inj, PF 12/20/2014   Pneumococcal Conjugate-13 03/28/2013   Pneumococcal Polysaccharide-23 07/12/2002   Td 11/02/2000   There are no preventive care reminders to display for this patient. No flowsheet data found. Functional Status Survey:    Vitals:   10/25/18 1003  BP: 135/78  Pulse: 78  Resp: 18  Temp: 98.2 F (36.8 C)  TempSrc: Oral  SpO2: 100%  Weight: 174 lb 2.6 oz (79 kg)  Height: 6' (1.829 m)   Body mass index is 23.62 kg/m. Physical Exam   In general this is a 84-year-old frail elderly male in no distress resting comfortably in bed he is alert and does make eye contact and speaks a small amount.  The skin is warm and dry lower back buttocks area does appear to have some scaly reddened areas-this will be evaluated by wound care-apparently he has been scratching these areas-at this point do not see signs of overt infection  Eyes visual acuity appears to be intact throat clear.  Oropharynx mucous membranes appear fairly moist he does have a white film on his tongue.  Chest is clear to auscultation with somewhat poor respiratory effort there is no labored breathing could not really appreciate any overt congestion.  Heart is regular rate and rhythm with occasional irregular beats-does not really have significant lower extremity edema.  Abdomen is soft nontender with positive bowel sounds.  Musculoskeletal Limited exam since he is  in bed but is able to move all extremities x4 per discussion with physical therapy who has just evaluated him he does have decent strength in all 4 extremities.  Neurologic could not really appreciate lateralizing findings his speech is slow clear.  Psych he is oriented to self he can tell me his name and that he was from [facility]-- but could not really tell me much else-he stated the president was [place].         labs reviewed: Recent Labs    10/14/18 1737 10/14/18 1739 10/15/18 0243  NA 143 142 142  K 4.1 4.0 3.9  CL 105 105 104  CO2 27  --  27  GLUCOSE 131* 122* 114*  BUN CREATININE 0.96 0.80 0.83  CALCIUM 9.2  --  8.8*   Recent Labs    10/14/18 1737 10/15/18 0243  AST 18 14*  ALT 12 7  ALKPHOS 50 51  BILITOT 0.7 0.4  PROT 7.3 6.5  ALBUMIN 3.2* 2.6*   Recent Labs    10/14/18 1737 10/14/18 1739 10/15/18 0243  WBC 9.0  --  10.3  NEUTROABS 7.2  --   --   HGB 10.8* 12.2* 10.0*  HCT 32.6* 36.0* 31.2*  MCV 93.1  --  93.4  PLT 320  --  276   Lab Results  Component Value Date   TSH 2.618 10/14/2018   Lab Results  Component Value Date   HGBA1C 5.5 10/15/2018   Lab Results  Component Value Date   CHOL 138 10/15/2018   HDL 47 10/15/2018   LDLCALC 79 10/15/2018   TRIG 61 10/15/2018   CHOLHDL 2.9 10/15/2018    Significant Diagnostic Results in last 30 days:  Mr Angio Head Wo Contrast  Result Date: 10/15/2018 CLINICAL DATA:  Encephalopathy EXAM: MRI HEAD WITHOUT CONTRAST MRA HEAD WITHOUT CONTRAST TECHNIQUE: Multiplanar, multiecho pulse sequences of the brain and surrounding structures were obtained without intravenous contrast. Angiographic images of the head were obtained using MRA technique without contrast. COMPARISON:  Head CT 10/14/2018 FINDINGS: MRI HEAD FINDINGS BRAIN: There is no acute infarct, acute hemorrhage or extra-axial collection. Early confluent hyperintense T2-weighted signal of the periventricular and deep white matter, most  commonly due to chronic ischemic microangiopathy. Generalized advanced atrophy. No hydrocephalus. The midline structures are normal. There are multiple old small vessel infarcts. VASCULAR: The major intracranial arterial and venous sinus flow voids are normal. Susceptibility weighted imaging shows multiple old microhemorrhages as well as sequelae of prior hemorrhagic events in the deep gray nuclei. SKULL AND UPPER CERVICAL SPINE: Calvarial bone marrow signal is normal. There is no skull base mass. The visualized upper cervical spine and soft tissues are normal. SINUSES/ORBITS: There are no fluid levels or advanced mucosal thickening. The mastoid air cells and middle ear cavities are free of fluid. The orbits are normal. MRA HEAD FINDINGS POSTERIOR CIRCULATION: --Vertebral arteries: Normal V4 segments. --Posterior inferior cerebellar arteries (PICA): Patent origins from the vertebral arteries. --Anterior inferior cerebellar arteries (AICA): Patent origins from the basilar artery. --Basilar artery: Normal. --Superior cerebellar arteries: Normal. --Posterior cerebral arteries: Normal. The left PCA is partially supplied by a posterior communicating artery (p-comm). ANTERIOR CIRCULATION: --Intracranial internal carotid arteries: Normal. --Anterior cerebral arteries (ACA): Normal. Both A1 segments are present. Patent anterior communicating artery (a-comm). --Middle cerebral arteries (MCA): Normal. IMPRESSION: 1. No acute intracranial abnormality. 2. Generalized advanced atrophy and chronic ischemic microangiopathy. 3. Multiple old infarcts and chronic microhemorrhages. 4. Normal MRA of the Circle of Willis. Electronically Signed   By: Ulyses Jarred M.D.   On: 10/15/2018 01:20   Mr Brain Wo Contrast  Result Date: 10/15/2018 CLINICAL DATA:  Encephalopathy EXAM: MRI HEAD WITHOUT CONTRAST MRA HEAD WITHOUT CONTRAST TECHNIQUE: Multiplanar, multiecho pulse sequences of the brain and surrounding structures were obtained  without intravenous contrast. Angiographic images of the head were obtained using MRA technique without contrast. COMPARISON:  Head CT 10/14/2018 FINDINGS: MRI HEAD FINDINGS BRAIN: There is no acute infarct, acute hemorrhage or extra-axial collection. Early confluent hyperintense T2-weighted signal of the periventricular and deep white matter, most commonly due to chronic ischemic microangiopathy. Generalized advanced atrophy. No hydrocephalus. The midline structures are normal. There are multiple old small vessel infarcts. VASCULAR: The major intracranial arterial and venous sinus flow voids are normal. Susceptibility weighted imaging shows multiple old microhemorrhages as well as sequelae of prior hemorrhagic events in the deep gray nuclei. SKULL AND UPPER CERVICAL SPINE: Calvarial bone marrow signal is normal. There is no skull base mass. The visualized upper cervical spine and soft tissues are normal. SINUSES/ORBITS: There are no fluid levels or advanced mucosal thickening. The mastoid  air cells and middle ear cavities are free of fluid. The orbits are normal. MRA HEAD FINDINGS POSTERIOR CIRCULATION: --Vertebral arteries: Normal V4 segments. --Posterior inferior cerebellar arteries (PICA): Patent origins from the vertebral arteries. --Anterior inferior cerebellar arteries (AICA): Patent origins from the basilar artery. --Basilar artery: Normal. --Superior cerebellar arteries: Normal. --Posterior cerebral arteries: Normal. The left PCA is partially supplied by a posterior communicating artery (p-comm). ANTERIOR CIRCULATION: --Intracranial internal carotid arteries: Normal. --Anterior cerebral arteries (ACA): Normal. Both A1 segments are present. Patent anterior communicating artery (a-comm). --Middle cerebral arteries (MCA): Normal. IMPRESSION: 1. No acute intracranial abnormality. 2. Generalized advanced atrophy and chronic ischemic microangiopathy. 3. Multiple old infarcts and chronic microhemorrhages. 4.  Normal MRA of the Circle of Willis. Electronically Signed   By: Deatra Robinson M.D.   On: 10/15/2018 01:20   Dg Chest Port 1 View  Result Date: 10/14/2018 CLINICAL DATA:  84 year old male with history of weakness. Left-sided neglect and left facial droop. EXAM: PORTABLE CHEST 1 VIEW COMPARISON:  Chest x-ray 07/24/2018. FINDINGS: Lung volumes are low. No consolidative airspace disease. No pleural effusions. No pneumothorax. No pulmonary nodule or mass noted. Pulmonary vasculature and the cardiomediastinal silhouette are within normal limits. IMPRESSION: 1. Low lung volumes without radiographic evidence acute cardiopulmonary disease. Electronically Signed   By: Trudie Reed M.D.   On: 10/14/2018 20:44   Dg Swallowing Func-speech Pathology  Result Date: 10/17/2018 Objective Swallowing Evaluation: Type of Study: MBS-Modified Barium Swallow Study  Patient Details Name: Julus Kelley. MRN: [id number] Date of Birth: [birth year] Today's Date: 10/17/2018 Time: SLP Start Time (ACUTE ONLY): 4098 -SLP Stop Time (ACUTE ONLY): 0859 SLP Time Calculation (min) (ACUTE ONLY): 24 min Past Medical History: Past Medical History: Diagnosis Date  CAD (coronary artery disease)   Hemorrhagic stroke (HCC)   Hyperlipidemia   Hypertension  Past Surgical History: Past Surgical History: Procedure Laterality Date  PEG PLACEMENT   HPI: 84 yo male adm to [facility] with AMS - MRI negative except for old gangionic, left thalamic and caudate cva.  Pt cxr showed low lung volumes.  Pt reportedly resides with his wife.  He choked on pills. Per chart review, pt has h/o PEG tube placement- ? indication?  Subjective: pt awake in chair Assessment / Plan / Recommendation CHL IP CLINICAL IMPRESSIONS 10/17/2018 Clinical Impression The patient presents with moderate oral dysphagia characterized by delayed oral transit, lingual pumping and premature spill.  Severe Pharyngeal dysphagia with decreased epiglottic deflection, decreased hyo-laryngeal  excursion, little to no pharyngeal constriction resulting in gross vallecula and moderate pyriform residue (that mix with secretions). The patient was observed to swallow multiple times inconsistently in attempt to clear the pharyngeal residue and generally was not successful. Head turn right did not decrease accumulation in pharynx. Cued "Hock" was not adequate and thus no residual was removed.  Cricopharyngeal opening appeared to be inadequate.  Pt with gross retention (mixed with secretions) in phayrnx without consistent sensation nor ability to clear.  Laryngeal penetration noted with all liquids with pt demonstrating reflexive throat clear but this did not successfully clear material from the laryngeal vestibule.  Given h/o dysphagia requiring PEG placement, suspect this is an exacerbation of baseline difficulties. Given pt's advanced age, dementia and acute on chronic dysphagia (dating back to at least 2016) - recommend to consider a palliative consult to establish goals of care.  Pt admits he is losing weight and has been coughing with intake prior to admit.  Rec npo except tsps water and ice chips at  this time. Marland Kitchen SLP Visit Diagnosis Dysphagia, oropharyngeal phase (R13.12);Dysphagia, pharyngoesophageal phase (R13.14) Attention and concentration deficit following -- Frontal lobe and executive function deficit following -- Impact on safety and function Severe aspiration risk;Risk for inadequate nutrition/hydration   CHL IP TREATMENT RECOMMENDATION 10/17/2018 Treatment Recommendations Therapy as outlined in treatment plan below   Prognosis 10/17/2018 Prognosis for Safe Diet Advancement Guarded Barriers to Reach Goals Severity of deficits;Other (Comment) Barriers/Prognosis Comment premorbid deficits dating back to at least 2016 - pt underwent mbs 2017 with recommendation for npo CHL IP DIET RECOMMENDATION 10/17/2018 SLP Diet Recommendations NPO;Ice chips PRN after oral care Liquid Administration via Spoon Medication  Administration Via alternative means Compensations Multiple dry swallows after each bite/sip Postural Changes --   No flowsheet data found.  CHL IP FOLLOW UP RECOMMENDATIONS 10/17/2018 Follow up Recommendations (No Data)   CHL IP FREQUENCY AND DURATION 10/17/2018 Speech Therapy Frequency (ACUTE ONLY) min 2x/week Treatment Duration 1 week      CHL IP ORAL PHASE 10/17/2018 Oral Phase Impaired Oral - Pudding Teaspoon -- Oral - Pudding Cup -- Oral - Honey Teaspoon -- Oral - Honey Cup -- Oral - Nectar Teaspoon Delayed oral transit;Weak lingual manipulation Oral - Nectar Cup Delayed oral transit;Weak lingual manipulation Oral - Nectar Straw Weak lingual manipulation;Delayed oral transit Oral - Thin Teaspoon Weak lingual manipulation;Delayed oral transit Oral - Thin Cup Weak lingual manipulation;Delayed oral transit Oral - Thin Straw Weak lingual manipulation;Delayed oral transit Oral - Puree Weak lingual manipulation;Delayed oral transit;Lingual pumping Oral - Mech Soft NT Oral - Regular -- Oral - Multi-Consistency -- Oral - Pill NT Oral Phase - Comment --  CHL IP PHARYNGEAL PHASE 10/17/2018 Pharyngeal Phase Impaired Pharyngeal- Pudding Teaspoon -- Pharyngeal -- Pharyngeal- Pudding Cup -- Pharyngeal -- Pharyngeal- Honey Teaspoon -- Pharyngeal -- Pharyngeal- Honey Cup -- Pharyngeal -- Pharyngeal- Nectar Teaspoon Reduced laryngeal elevation;Reduced anterior laryngeal mobility;Reduced airway/laryngeal closure;Reduced epiglottic inversion;Pharyngeal residue - valleculae;Pharyngeal residue - pyriform;Reduced pharyngeal peristalsis Pharyngeal Material does not enter airway Pharyngeal- Nectar Cup Reduced epiglottic inversion;Reduced anterior laryngeal mobility;Reduced laryngeal elevation;Reduced airway/laryngeal closure;Reduced tongue base retraction;Pharyngeal residue - valleculae;Pharyngeal residue - pyriform;Reduced pharyngeal peristalsis Pharyngeal Material does not enter airway Pharyngeal- Nectar Straw Reduced laryngeal  elevation;Reduced anterior laryngeal mobility;Reduced epiglottic inversion;Reduced pharyngeal peristalsis;Reduced airway/laryngeal closure;Pharyngeal residue - valleculae;Pharyngeal residue - pyriform;Reduced tongue base retraction Pharyngeal Material does not enter airway Pharyngeal- Thin Teaspoon Reduced epiglottic inversion;Reduced anterior laryngeal mobility;Reduced laryngeal elevation;Reduced airway/laryngeal closure;Reduced tongue base retraction;Penetration/Aspiration during swallow;Pharyngeal residue - valleculae;Pharyngeal residue - pyriform;Reduced pharyngeal peristalsis Pharyngeal Material enters airway, remains ABOVE vocal cords and not ejected out Pharyngeal- Thin Cup Reduced pharyngeal peristalsis;Reduced epiglottic inversion;Reduced anterior laryngeal mobility;Reduced airway/laryngeal closure;Reduced tongue base retraction;Reduced laryngeal elevation;Pharyngeal residue - valleculae;Pharyngeal residue - pyriform;Penetration/Aspiration during swallow;Penetration/Apiration after swallow Pharyngeal Material enters airway, CONTACTS cords and not ejected out Pharyngeal- Thin Straw Reduced epiglottic inversion;Reduced anterior laryngeal mobility;Reduced laryngeal elevation;Reduced airway/laryngeal closure;Reduced tongue base retraction;Pharyngeal residue - valleculae;Pharyngeal residue - pyriform Pharyngeal Material enters airway, passes BELOW cords and not ejected out despite cough attempt by patient Pharyngeal- Puree Reduced epiglottic inversion;Reduced anterior laryngeal mobility;Reduced laryngeal elevation;Reduced airway/laryngeal closure;Reduced tongue base retraction;Pharyngeal residue - valleculae;Reduced pharyngeal peristalsis Pharyngeal Material does not enter airway Pharyngeal- Mechanical Soft NT Pharyngeal -- Pharyngeal- Regular -- Pharyngeal -- Pharyngeal- Multi-consistency -- Pharyngeal -- Pharyngeal- Pill NT Pharyngeal -- Pharyngeal Comment --  CHL IP CERVICAL ESOPHAGEAL PHASE 10/17/2018  Cervical Esophageal Phase Impaired Pudding Teaspoon -- Pudding Cup -- Honey Teaspoon -- Honey Cup -- Nectar Teaspoon Reduced cricopharyngeal relaxation Nectar Cup Reduced cricopharyngeal relaxation Nectar  Straw Reduced cricopharyngeal relaxation Thin Teaspoon Reduced cricopharyngeal relaxation Thin Cup Reduced cricopharyngeal relaxation Thin Straw Reduced cricopharyngeal relaxation Puree Reduced cricopharyngeal relaxation Mechanical Soft -- Regular -- Multi-consistency -- Pill -- Cervical Esophageal Comment -- Chales Abrahams 10/17/2018, 9:24 AM Donavan Burnet, MS [facility] SLP Acute Rehab Services Pager [phone] Office [phone]              Ct Head Code Stroke Wo Contrast  Result Date: 10/14/2018 CLINICAL DATA:  Code stroke. Focal neuro deficit, less than 6 hours, stroke suspected. Additional history provided: Left-sided neglect, left facial droop. EXAM: CT HEAD WITHOUT CONTRAST TECHNIQUE: Contiguous axial images were obtained from the base of the skull through the vertex without intravenous contrast. COMPARISON:  No pertinent prior studies available for comparison. FINDINGS: Brain: No evidence of acute intracranial hemorrhage. No acute demarcated cortical infarction identified. Ill-defined hypoattenuation of the cerebral white matter is nonspecific, but consistent with chronic small vessel ischemic disease. Chronic appearing lacunar infarcts within the left corona radiata/internal capsule and within the left thalamus. Age-indeterminate lacunar infarct within the left caudate nucleus. No evidence of intracranial mass. No midline shift or extra-axial fluid collection. Moderate generalized parenchymal atrophy. Vascular: No hyperdense vessel. Skull: No calvarial fracture Sinuses/Orbits: Visualized orbits demonstrate no acute abnormality. Mucosal thickening within the partially imaged left maxillary sinus. Trace left mastoid effusion. ASPECTS [facility] Stroke Program Early CT Score) - Ganglionic level  infarction (caudate, lentiform nuclei, internal capsule, insula, M1-M3 cortex): 7 - Supraganglionic infarction (M4-M6 cortex): 3 Total score (0-10 with 10 being normal): 10 (in the right MCA vascular territory). These results were called by telephone at the time of interpretation on 10/14/2018 at 5:57 pm to provider Dr. Roda Shutters, who verbally acknowledged these results. IMPRESSION: No evidence of intracranial hemorrhage or acute demarcated cortical infarction. Age-indeterminate lacunar infarct within the left caudate. Chronic lacunar infarcts within the left corona radiata/internal capsule and left thalamus. Generalized parenchymal atrophy with chronic small vessel ischemic disease. Electronically Signed   By: Jackey Loge   On: 10/14/2018 18:07    Assessment/Plan  #1 history of altered mental status secondary to UTI Proteus--as well as baseline dementia.  He did respond to a course of Rocephin and mental status apparently has returned to baseline.  His Seroquel was DC'd apparently because of somnolence.  At this point we will continue to monitor and give supportive care.  He was seen by palliative care in the hospital-family stated goal is comfort-with no aggressive interventions.  At this point continue to monitor he appears to be stable and comfortable at this point.  2.  History of severe protein calorie malnutrition-failure to thrive --at one point he did have a PEG tube as noted above per patient's wishes he no longer has this-aspiration risk has been noted but preference is for oral feedings versus enteral  Nutrition  #3 history of CVA he continues on statin he is also on low-dose aspirin at this point will monitor.  4.  History of coronary artery disease again continue simvastatin low-dose aspirin he is also on Toprol 25 mg a day.  5.  History of hypertension at this point will monitor he is on hydralazine 50 mg twice daily as well as Norvasc 2.5 mg a day Toprol 25 mg a day and Cozaar 25 mg a  day  #6 history diabetes type 2-his blood sugars in the hospital were in the low 100s generally-I do see 1 listed in the 50s-will have CBG check every morning.  7.  History of atrial  fibrillation this appears to be rate controlled he is not on any anticoagulation other than low-dose aspirin because of his fall risk.  8.  History of gout he continues on allopurinol 300 mg p.o. daily.  9.  History of suspected thrush will order nystatin swallow and spit up for 1 week and monitor this was discussed with nursing.  [id number]-note greater than 35 minutes spent assessing patient-reviewing his chart and labs-coordinating a formulating a plan of care for numerous diagnoses-of note greater than 50% of time spent coordinating a plan of care with input as noted above     Edmon CrapeArlo Saya Mccoll, PA-C [phone]

## 2018-10-27 ENCOUNTER — Non-Acute Institutional Stay (SKILLED_NURSING_FACILITY): Payer: Medicare Other | Admitting: Internal Medicine

## 2018-10-27 DIAGNOSIS — A498 Other bacterial infections of unspecified site: Secondary | ICD-10-CM | POA: Diagnosis not present

## 2018-10-27 DIAGNOSIS — E860 Dehydration: Secondary | ICD-10-CM

## 2018-10-27 DIAGNOSIS — R1310 Dysphagia, unspecified: Secondary | ICD-10-CM

## 2018-10-27 DIAGNOSIS — K219 Gastro-esophageal reflux disease without esophagitis: Secondary | ICD-10-CM

## 2018-10-27 DIAGNOSIS — I10 Essential (primary) hypertension: Secondary | ICD-10-CM

## 2018-10-27 DIAGNOSIS — E785 Hyperlipidemia, unspecified: Secondary | ICD-10-CM

## 2018-10-27 DIAGNOSIS — M1A00X0 Idiopathic chronic gout, unspecified site, without tophus (tophi): Secondary | ICD-10-CM

## 2018-10-27 DIAGNOSIS — G9340 Encephalopathy, unspecified: Secondary | ICD-10-CM | POA: Diagnosis not present

## 2018-10-27 DIAGNOSIS — N390 Urinary tract infection, site not specified: Secondary | ICD-10-CM

## 2018-10-27 NOTE — Progress Notes (Signed)
: Provider:  Hennie Duos., MD Location:  [facility] Room Number: 505-P Place of Service:  SNF [phone]  PCP: Burman Freestone, MD Patient Care Team: Burman Freestone, MD as PCP - General (Internal Medicine)  Extended Emergency Contact Information Primary Emergency Contact: Madelaine Etienne Mobile Phone: [phone] Relation: Daughter Secondary Emergency Contact: seegars, tamica Mobile Phone: [phone] Relation: Daughter     Allergies: Patient has no known allergies.  Chief Complaint  Patient presents with   New Admit To SNF    New admission to [facility] SNF    HPI: Patient is an 84 y.o. male with advanced dementia, chronic diastolic congestive heart failure, history of hemorrhagic stroke, paroxysmal atrial fibrillation not on AC due to fall risk, hypertension, eosinophilic esophagitis, diabetes mellitus, and CAD who resented to [facility] with decreased sensorium and confusion and possible left facial droop and left upper extremity neglect.  In the ER patient was noted to have a temperature of 100.3 and a UA concerning for UTI.  Patient was admitted to [facility] from 10/3-13 where he was treated for acute metabolic encephalopathy due to a Proteus UTI and to dehydration.  MRI of the brain was negative for acute stroke.  Seroquel was stopped due to his potential for sleepiness.  Patient encephalopathy resolved with IV fluids and with a course of Rocephin for Proteus UTI.  Patient met with palliative care during hospitalization and was made a DNR, no aggressive measures, continue supportive care.  Patient is admitted to skilled nursing facility for OT/PT if he is able and willing and supportive therapy is not.  While at skilled nursing facility patient will be followed for hypertension treated with Norvasc hydralazine losartan and metoprolol, gout treated with allopurinol and GERD treated with omeprazole.  Past Medical History:   Diagnosis Date   CAD (coronary artery disease)    Hemorrhagic stroke ([place])    Hyperlipidemia    Hypertension     Past Surgical History:  Procedure Laterality Date   PEG PLACEMENT      Allergies as of 10/27/2018   No Known Allergies     Medication List       Accurate as of October 27, 2018 10:30 PM. If you have any questions, ask your nurse or doctor.        allopurinol 300 MG tablet Commonly known as: ZYLOPRIM Take 300 mg by mouth every morning.   amLODipine 2.5 MG tablet Commonly known as: NORVASC Take 2.5 mg by mouth every morning.   aspirin EC 81 MG tablet Take 81 mg by mouth every morning.   hydrALAZINE 50 MG tablet Commonly known as: APRESOLINE Take 50 mg by mouth 2 (two) times daily. Hold for SBP <100   losartan 25 MG tablet Commonly known as: COZAAR Take 25 mg by mouth every morning.   magnesium oxide 400 MG tablet Commonly known as: MAG-OX Take 400 mg by mouth every morning.   metoprolol succinate 25 MG 24 hr tablet Commonly known as: TOPROL-XL Take 25 mg by mouth every morning.   Nutritional Supplement Liqd Take 4 oz by mouth 2 (two) times daily. MedPass   nystatin 100000 UNIT/ML suspension Commonly known as: MYCOSTATIN Take 5 mLs by mouth 4 (four) times daily.   omeprazole 40 MG capsule Commonly known as: PRILOSEC Take 40 mg by mouth every morning.   pravastatin 10 MG tablet Commonly known as: PRAVACHOL Take 10 mg by mouth at bedtime.   vitamin B-12  250 MCG tablet Commonly known as: CYANOCOBALAMIN Take 250 mcg by mouth every morning.   VITAMIN D3 PO Take 1,000 Units by mouth every morning.       No orders of the defined types were placed in this encounter.   Immunization History  Administered Date(s) Administered   Influenza, High Dose Seasonal PF 10/14/2015, 09/21/2016   Influenza,inj,Quad PF,6+ Mos 09/01/2017   Influenza,trivalent, recombinat, inj, PF 12/20/2014   Pneumococcal Conjugate-13 03/28/2013    Pneumococcal Polysaccharide-23 07/12/2002   Td 11/02/2000    Social History   Tobacco Use   Smoking status: Former Smoker   Smokeless tobacco: Never Used  Substance Use Topics   Alcohol use: Not Currently    Family history is mom with diabetes and hypertension  No family history on file.    Review of Systems  DATA OBTAINED: from patient-very limited; nursing-no acute concerns GENERAL:  no fevers, fatigue, appetite changes SKIN: No itching, or rash EYES: No eye pain, redness, discharge EARS: No earache, tinnitus, change in hearing NOSE: No congestion, drainage or bleeding  MOUTH/THROAT: No mouth or tooth pain, No sore throat RESPIRATORY: No cough, wheezing, SOB CARDIAC: No chest pain, palpitations, lower extremity edema  GI: No abdominal pain, No N/V/D or constipation, No heartburn or reflux  GU: No dysuria, frequency or urgency, or incontinence  MUSCULOSKELETAL: No unrelieved bone/joint pain NEUROLOGIC: No headache, dizziness or focal weakness PSYCHIATRIC: No c/o anxiety or sadness   Vitals:   10/27/18 1520  BP: (!) 150/82  Pulse: 68  Resp: 18  Temp: (!) 97 F (36.1 C)    SpO2 Readings from Last 1 Encounters:  10/25/18 100%   Body mass index is 23.62 kg/m.     Physical Exam  GENERAL APPEARANCE: Alert, moderately conversant,  No acute distress.  SKIN: No diaphoresis rash HEAD: Normocephalic, atraumatic  EYES: Conjunctiva/lids clear. Pupils round, reactive. EOMs intact.  EARS: External exam WNL, canals clear. Hearing grossly normal.  NOSE: No deformity or discharge.  MOUTH/THROAT: Lips w/o lesions  RESPIRATORY: Breathing is even, unlabored. Lung sounds are clear   CARDIOVASCULAR: Heart RRR no murmurs, rubs or gallops. No peripheral edema.   GASTROINTESTINAL: Abdomen is soft, non-tender, not distended w/ normal bowel sounds. GENITOURINARY: Bladder non tender, not distended  MUSCULOSKELETAL: No abnormal joints or musculature NEUROLOGIC:  Cranial  nerves 2-12 grossly intact. Moves all extremities  PSYCHIATRIC: Dementia, no behavioral issues  Patient Active Problem List   Diagnosis Date Noted   Adult failure to thrive    DNR (do not resuscitate)    Palliative care by specialist    Dysphagia    Encephalopathy acute 10/15/2018   Acute lower UTI 10/14/2018   Altered mental status 10/14/2018   Hypertension 10/14/2018   Hyperlipidemia 10/14/2018      Labs reviewed: Basic Metabolic Panel:    Component Value Date/Time   NA 142 10/15/2018 0243   K 3.9 10/15/2018 0243   CL 104 10/15/2018 0243   CO2 27 10/15/2018 0243   GLUCOSE 114 (H) 10/15/2018 0243   BUN 14 10/15/2018 0243   CREATININE 0.83 10/15/2018 0243   CALCIUM 8.8 (L) 10/15/2018 0243   PROT 6.5 10/15/2018 0243   ALBUMIN 2.6 (L) 10/15/2018 0243   AST 14 (L) 10/15/2018 0243   ALT 7 10/15/2018 0243   ALKPHOS 51 10/15/2018 0243   BILITOT 0.4 10/15/2018 0243   GFRNONAA >60 10/15/2018 0243   GFRAA >60 10/15/2018 0243    Recent Labs    10/14/18 1737 10/14/18 1739 10/15/18 0243  NA 143 142 142  K 4.1 4.0 3.9  CL 105 105 104  CO2 27  --  27  GLUCOSE 131* 122* 114*  BUN _0 CREATININE 0.96 0.80 0.83  CALCIUM 9.2  --  8.8*   Liver Function Tests: Recent Labs    10/14/18 1737 10/15/18 0243  AST 18 14*  ALT 12 7  ALKPHOS 50 51  BILITOT 0.7 0.4  PROT 7.3 6.5  ALBUMIN 3.2* 2.6*   No results for input(s): LIPASE, AMYLASE in the last 8760 hours. Recent Labs    10/14/18 1848  AMMONIA 53*   CBC: Recent Labs    10/14/18 1737 10/14/18 1739 10/15/18 0243  WBC 9.0  --  10.3  NEUTROABS 7.2  --   --   HGB 10.8* 12.2* 10.0*  HCT 32.6* 36.0* 31.2*  MCV 93.1  --  93.4  PLT 320  --  276   Lipid Recent Labs    10/15/18 0242  CHOL 138  HDL 47  LDLCALC 79  TRIG 61    Cardiac Enzymes: No results for input(s): CKTOTAL, CKMB, CKMBINDEX, TROPONINI in the last 8760 hours. BNP: No results for input(s): BNP in the last 8760 hours. No  results found for: [facility] Lab Results  Component Value Date   HGBA1C 5.5 10/15/2018   Lab Results  Component Value Date   TSH 2.618 10/14/2018   Lab Results  Component Value Date   [id number] 390 10/14/2018   No results found for: FOLATE No results found for: IRON, TIBC, FERRITIN  Imaging and Procedures obtained prior to SNF admission: Mr Angio Head Wo Contrast  Result Date: 10/15/2018 CLINICAL DATA:  Encephalopathy EXAM: MRI HEAD WITHOUT CONTRAST MRA HEAD WITHOUT CONTRAST TECHNIQUE: Multiplanar, multiecho pulse sequences of the brain and surrounding structures were obtained without intravenous contrast. Angiographic images of the head were obtained using MRA technique without contrast. COMPARISON:  Head CT 10/14/2018 FINDINGS: MRI HEAD FINDINGS BRAIN: There is no acute infarct, acute hemorrhage or extra-axial collection. Early confluent hyperintense T2-weighted signal of the periventricular and deep white matter, most commonly due to chronic ischemic microangiopathy. Generalized advanced atrophy. No hydrocephalus. The midline structures are normal. There are multiple old small vessel infarcts. VASCULAR: The major intracranial arterial and venous sinus flow voids are normal. Susceptibility weighted imaging shows multiple old microhemorrhages as well as sequelae of prior hemorrhagic events in the deep gray nuclei. SKULL AND UPPER CERVICAL SPINE: Calvarial bone marrow signal is normal. There is no skull base mass. The visualized upper cervical spine and soft tissues are normal. SINUSES/ORBITS: There are no fluid levels or advanced mucosal thickening. The mastoid air cells and middle ear cavities are free of fluid. The orbits are normal. MRA HEAD FINDINGS POSTERIOR CIRCULATION: --Vertebral arteries: Normal V4 segments. --Posterior inferior cerebellar arteries (PICA): Patent origins from the vertebral arteries. --Anterior inferior cerebellar arteries (AICA): Patent origins from the basilar artery.  --Basilar artery: Normal. --Superior cerebellar arteries: Normal. --Posterior cerebral arteries: Normal. The left PCA is partially supplied by a posterior communicating artery (p-comm). ANTERIOR CIRCULATION: --Intracranial internal carotid arteries: Normal. --Anterior cerebral arteries (ACA): Normal. Both A1 segments are present. Patent anterior communicating artery (a-comm). --Middle cerebral arteries (MCA): Normal. IMPRESSION: 1. No acute intracranial abnormality. 2. Generalized advanced atrophy and chronic ischemic microangiopathy. 3. Multiple old infarcts and chronic microhemorrhages. 4. Normal MRA of the Circle of Willis. Electronically Signed   By: Ulyses Jarred M.D.   On: 10/15/2018 01:20   Mr Brain Wo Contrast  Result  Date: 10/15/2018 CLINICAL DATA:  Encephalopathy EXAM: MRI HEAD WITHOUT CONTRAST MRA HEAD WITHOUT CONTRAST TECHNIQUE: Multiplanar, multiecho pulse sequences of the brain and surrounding structures were obtained without intravenous contrast. Angiographic images of the head were obtained using MRA technique without contrast. COMPARISON:  Head CT 10/14/2018 FINDINGS: MRI HEAD FINDINGS BRAIN: There is no acute infarct, acute hemorrhage or extra-axial collection. Early confluent hyperintense T2-weighted signal of the periventricular and deep white matter, most commonly due to chronic ischemic microangiopathy. Generalized advanced atrophy. No hydrocephalus. The midline structures are normal. There are multiple old small vessel infarcts. VASCULAR: The major intracranial arterial and venous sinus flow voids are normal. Susceptibility weighted imaging shows multiple old microhemorrhages as well as sequelae of prior hemorrhagic events in the deep gray nuclei. SKULL AND UPPER CERVICAL SPINE: Calvarial bone marrow signal is normal. There is no skull base mass. The visualized upper cervical spine and soft tissues are normal. SINUSES/ORBITS: There are no fluid levels or advanced mucosal thickening. The  mastoid air cells and middle ear cavities are free of fluid. The orbits are normal. MRA HEAD FINDINGS POSTERIOR CIRCULATION: --Vertebral arteries: Normal V4 segments. --Posterior inferior cerebellar arteries (PICA): Patent origins from the vertebral arteries. --Anterior inferior cerebellar arteries (AICA): Patent origins from the basilar artery. --Basilar artery: Normal. --Superior cerebellar arteries: Normal. --Posterior cerebral arteries: Normal. The left PCA is partially supplied by a posterior communicating artery (p-comm). ANTERIOR CIRCULATION: --Intracranial internal carotid arteries: Normal. --Anterior cerebral arteries (ACA): Normal. Both A1 segments are present. Patent anterior communicating artery (a-comm). --Middle cerebral arteries (MCA): Normal. IMPRESSION: 1. No acute intracranial abnormality. 2. Generalized advanced atrophy and chronic ischemic microangiopathy. 3. Multiple old infarcts and chronic microhemorrhages. 4. Normal MRA of the Circle of Willis. Electronically Signed   By: Ulyses Jarred M.D.   On: 10/15/2018 01:20   Dg Chest Port 1 View  Result Date: 10/14/2018 CLINICAL DATA:  84 year old male with history of weakness. Left-sided neglect and left facial droop. EXAM: PORTABLE CHEST 1 VIEW COMPARISON:  Chest x-ray 07/24/2018. FINDINGS: Lung volumes are low. No consolidative airspace disease. No pleural effusions. No pneumothorax. No pulmonary nodule or mass noted. Pulmonary vasculature and the cardiomediastinal silhouette are within normal limits. IMPRESSION: 1. Low lung volumes without radiographic evidence acute cardiopulmonary disease. Electronically Signed   By: Vinnie Langton M.D.   On: 10/14/2018 20:44   Ct Head Code Stroke Wo Contrast  Result Date: 10/14/2018 CLINICAL DATA:  Code stroke. Focal neuro deficit, less than 6 hours, stroke suspected. Additional history provided: Left-sided neglect, left facial droop. EXAM: CT HEAD WITHOUT CONTRAST TECHNIQUE: Contiguous axial images  were obtained from the base of the skull through the vertex without intravenous contrast. COMPARISON:  No pertinent prior studies available for comparison. FINDINGS: Brain: No evidence of acute intracranial hemorrhage. No acute demarcated cortical infarction identified. Ill-defined hypoattenuation of the cerebral white matter is nonspecific, but consistent with chronic small vessel ischemic disease. Chronic appearing lacunar infarcts within the left corona radiata/internal capsule and within the left thalamus. Age-indeterminate lacunar infarct within the left caudate nucleus. No evidence of intracranial mass. No midline shift or extra-axial fluid collection. Moderate generalized parenchymal atrophy. Vascular: No hyperdense vessel. Skull: No calvarial fracture Sinuses/Orbits: Visualized orbits demonstrate no acute abnormality. Mucosal thickening within the partially imaged left maxillary sinus. Trace left mastoid effusion. ASPECTS [facility] Stroke Program Early CT Score) - Ganglionic level infarction (caudate, lentiform nuclei, internal capsule, insula, M1-M3 cortex): 7 - Supraganglionic infarction (M4-M6 cortex): 3 Total score (0-10 with 10 being normal): 10 (  in the right MCA vascular territory). These results were called by telephone at the time of interpretation on 10/14/2018 at 5:57 pm to provider Dr. Erlinda Hong, who verbally acknowledged these results. IMPRESSION: No evidence of intracranial hemorrhage or acute demarcated cortical infarction. Age-indeterminate lacunar infarct within the left caudate. Chronic lacunar infarcts within the left corona radiata/internal capsule and left thalamus. Generalized parenchymal atrophy with chronic small vessel ischemic disease. Electronically Signed   By: Kellie Simmering   On: 10/14/2018 18:07     Not all labs, radiology exams or other studies done during hospitalization come through on my EPIC note; however they are reviewed by me.    Assessment and Plan  Proteus mirabilis  UTI/acute metabolic encephalopathy secondary to/dehydration-UTI treated with Rocephin IV and course was completed.  MRI of the brain was negative for acute stroke.  Seroquel was stopped due to potential for sleepiness.  Patient's cognitive baseline returned with IV fluids and the treatment of his UTI.  Patient's family met with palliative care and patient was made DNR, no aggressive measures continue supportive care SNF-admitted for OT/PT if possible if no aggressive measures and supportive care  Chronic dysphagia-ongoing for at least 3 years, tolerated soft diet hospital; patient had a PEG prior but patient's family does not wish a PEG SNF-continue supportive care with soft diet  Hypertension SNF-reasonably well-controlled today for condition; continue Norvasc 2.5 mg every morning, hydralazine 50 mg twice daily, losartan 25 mg daily, and metoprolol XL 25 mg every morning  Gout SNF-not currently active; continue allopurinol 300 mg nightly  GERD SNF-no reports of complaints; continue omeprazole 40 mg daily  Hyperlipidemia SNF-not stated as uncontrolled; continue Pravachol 10 mg daily    Time spent greater than 45 minutes;> 50% of time with patient was spent reviewing records, labs, tests and studies, counseling and developing plan of care  Hennie Duos, MD

## 2018-10-28 ENCOUNTER — Encounter: Payer: Self-pay | Admitting: Internal Medicine

## 2018-10-28 DIAGNOSIS — K219 Gastro-esophageal reflux disease without esophagitis: Secondary | ICD-10-CM | POA: Insufficient documentation

## 2018-10-28 DIAGNOSIS — M109 Gout, unspecified: Secondary | ICD-10-CM | POA: Insufficient documentation

## 2018-10-28 DIAGNOSIS — E860 Dehydration: Secondary | ICD-10-CM | POA: Insufficient documentation

## 2018-10-28 DIAGNOSIS — A498 Other bacterial infections of unspecified site: Secondary | ICD-10-CM | POA: Insufficient documentation

## 2018-11-02 ENCOUNTER — Non-Acute Institutional Stay (SKILLED_NURSING_FACILITY): Payer: Medicare Other | Admitting: Internal Medicine

## 2018-11-02 DIAGNOSIS — I10 Essential (primary) hypertension: Secondary | ICD-10-CM

## 2018-11-02 DIAGNOSIS — R627 Adult failure to thrive: Secondary | ICD-10-CM

## 2018-11-02 DIAGNOSIS — D649 Anemia, unspecified: Secondary | ICD-10-CM

## 2018-11-02 NOTE — Progress Notes (Signed)
This is an acute visit.  Level of care skilled.  [facility]  Chief complaint acute visit secondary to anemia.  History of present illness.  Patient is an 84 year old male seen today for an acute visit for labs which shows hemoglobin has gone down somewhat.  On today's lab was 8.8-previous readings appear to be more in the 10 range.  Patient has a history of advanced dementia chf with a history of hemorrhagic stroke as well as atrial fibrillation not on anticoagulation because of his fall risk he also has a history of hypertension type 2 diabetes and esophagitis.  Per staff he is doing well although he does have advanced dementia.  In the hospital he was diagnosed with a Proteus UTI and did respond to a course of Rocephin his Seroquel was discontinued because of somnolence.  He also has severe protein calorie malnutrition but he does have supplements and per nursing is eating fairly well which is encouraging.  He is an aspiration risk and family is aware of this but they do not desire a PEG tube.  Routine follow-up labs were ordered which show stability except his hemoglobin is down somewhat at 8.8 it appears he had been in the tens range in the hospital.  There is no evidence of acute bleeding per nursing.  Clinically he appears stable vital signs are stable.  He did have a B12 level done in the hospital which was in the normal range-it appears this anemia is normocytic.  Past Medical History:  Diagnosis Date  . CAD (coronary artery disease)   . Hemorrhagic stroke ([place])   . Hyperlipidemia   . Hypertension         Past Surgical History:  Procedure Laterality Date  . PEG PLACEMENT      No Known Allergies      Medications   Medication Sig  . allopurinol (ZYLOPRIM) 300 MG tablet Take 300 mg by mouth every morning.  Marland Kitchen amLODipine (NORVASC) 2.5 MG tablet Take 2.5 mg by mouth every morning.  Marland Kitchen aspirin EC 81 MG tablet Take 81 mg by mouth every  morning.  . Cholecalciferol (VITAMIN D3 PO) Take 1,000 Units by mouth every morning.   . hydrALAZINE (APRESOLINE) 50 MG tablet Take 50 mg by mouth 2 (two) times daily. Hold for SBP <100  . losartan (COZAAR) 25 MG tablet Take 25 mg by mouth every morning.  . magnesium oxide (MAG-OX) 400 MG tablet Take 400 mg by mouth every morning.  . metoprolol succinate (TOPROL-XL) 25 MG 24 hr tablet Take 25 mg by mouth every morning.  Marland Kitchen omeprazole (PRILOSEC) 40 MG capsule Take 40 mg by mouth every morning.  . pravastatin (PRAVACHOL) 10 MG tablet Take 10 mg by mouth at bedtime.  . vitamin B-12 (CYANOCOBALAMIN) 250 MCG tablet Take 250 mcg by mouth every morning.   Review of systems.  Is unobtainable secondary to dementia please see HPI per nursing he appears to be doing well do not report any behaviors or acute issues apparently is having decent p.o. intake.  Physical exam.  Temperature is 97.8 pulse 80 respirations 18 blood pressure 130/72   In general this is a pleasant elderly male he is not speaking much but he is alert and does speak minimally when asked questions-.    His skin is warm and dry.  Eyes visual acuity appears to be intact sclera and conjunctive are clear.  Oropharynx is clear mucous membranes moist  Chest is clear to auscultation with somewhat  poor respiratory effort he does not really follow verbal commands well could not appreciate any labored breathing or congestion.  Heart is regular rate and rhythm with an occasional irregular beat he does not have significant lower extremity edema.  Abdomen is soft nontender with positive bowel sounds.  Musculoskeletal is able to move all extremities x4 with some lower extremity weakness holds his upper arms and somewhat of a crossed arms posture.  Neurologic appears grossly intact complete assessment was difficult because he does not really follow verbal commands but cannot really appreciate lateralizing findings he does not speak much  but what he does say is clear.  Psych findings consistent with dementia he could tell me his first name and that he was from [place] but could not really tell me any more details.  Labs.  November 02, 2018.  WBC 3.4 hemoglobin 8.8 platelets 199. Sodium 141 potassium 3.7 BUN 12 creatinine 0.71.  Assessment and plan.  1.  Anemia-appears in hospital his hemoglobin ranged from 10 up to 12.2.  Most recent value in hospital was 10 it is now 8.8-.  We will write an order to guaiac stools x3.  Also will order CBC update in [redacted] week along with iron studies including total iron binding capacity serum iron reticulocyte count and ferritin.  At this point continue to monitor  #2-protein calorie malnutrition--- failure to thrive-he is on supplements --per nursing staff eats fairly well- --t this point continue supportive care which is family's desire without aggressive interventions-no PEG tube  #3 history of hypertension at this point appears to be stable on hydralazine 50 mg twice daily-as well as Norvasc 2.5 mg a day Toprol 25 mg a day as well as Cozaar 25 mg a day--recent blood pressures 130/72-118/66   [id number]

## 2018-11-03 ENCOUNTER — Encounter: Payer: Self-pay | Admitting: Internal Medicine

## 2018-11-05 ENCOUNTER — Encounter (HOSPITAL_COMMUNITY): Payer: Self-pay | Admitting: Emergency Medicine

## 2018-11-05 ENCOUNTER — Other Ambulatory Visit: Payer: Self-pay

## 2018-11-05 ENCOUNTER — Emergency Department (HOSPITAL_COMMUNITY): Payer: Medicare Other

## 2018-11-05 ENCOUNTER — Inpatient Hospital Stay (HOSPITAL_COMMUNITY)
Admission: EM | Admit: 2018-11-05 | Discharge: 2018-11-08 | DRG: 948 | Disposition: A | Discharge status: Hospice | Payer: Medicare Other | Source: Skilled Nursing Facility | Attending: Internal Medicine | Admitting: Internal Medicine

## 2018-11-05 DIAGNOSIS — R680 Hypothermia, not associated with low environmental temperature: Secondary | ICD-10-CM | POA: Diagnosis not present

## 2018-11-05 DIAGNOSIS — R0902 Hypoxemia: Secondary | ICD-10-CM | POA: Diagnosis present

## 2018-11-05 DIAGNOSIS — R1313 Dysphagia, pharyngeal phase: Secondary | ICD-10-CM | POA: Diagnosis present

## 2018-11-05 DIAGNOSIS — R1310 Dysphagia, unspecified: Secondary | ICD-10-CM | POA: Diagnosis not present

## 2018-11-05 DIAGNOSIS — I110 Hypertensive heart disease with heart failure: Secondary | ICD-10-CM | POA: Diagnosis present

## 2018-11-05 DIAGNOSIS — Z6823 Body mass index (BMI) 23.0-23.9, adult: Secondary | ICD-10-CM

## 2018-11-05 DIAGNOSIS — T68XXXA Hypothermia, initial encounter: Secondary | ICD-10-CM | POA: Diagnosis not present

## 2018-11-05 DIAGNOSIS — E119 Type 2 diabetes mellitus without complications: Secondary | ICD-10-CM | POA: Diagnosis present

## 2018-11-05 DIAGNOSIS — F0390 Unspecified dementia without behavioral disturbance: Secondary | ICD-10-CM | POA: Diagnosis present

## 2018-11-05 DIAGNOSIS — Z7982 Long term (current) use of aspirin: Secondary | ICD-10-CM

## 2018-11-05 DIAGNOSIS — Z66 Do not resuscitate: Secondary | ICD-10-CM | POA: Diagnosis present

## 2018-11-05 DIAGNOSIS — Z515 Encounter for palliative care: Secondary | ICD-10-CM

## 2018-11-05 DIAGNOSIS — I2510 Atherosclerotic heart disease of native coronary artery without angina pectoris: Secondary | ICD-10-CM | POA: Diagnosis present

## 2018-11-05 DIAGNOSIS — I447 Left bundle-branch block, unspecified: Secondary | ICD-10-CM | POA: Diagnosis present

## 2018-11-05 DIAGNOSIS — I69391 Dysphagia following cerebral infarction: Secondary | ICD-10-CM

## 2018-11-05 DIAGNOSIS — Z7189 Other specified counseling: Secondary | ICD-10-CM

## 2018-11-05 DIAGNOSIS — I4891 Unspecified atrial fibrillation: Secondary | ICD-10-CM | POA: Diagnosis present

## 2018-11-05 DIAGNOSIS — Z20828 Contact with and (suspected) exposure to other viral communicable diseases: Secondary | ICD-10-CM | POA: Diagnosis present

## 2018-11-05 DIAGNOSIS — Z87891 Personal history of nicotine dependence: Secondary | ICD-10-CM | POA: Diagnosis not present

## 2018-11-05 DIAGNOSIS — R1311 Dysphagia, oral phase: Secondary | ICD-10-CM | POA: Diagnosis present

## 2018-11-05 DIAGNOSIS — R4182 Altered mental status, unspecified: Secondary | ICD-10-CM | POA: Diagnosis present

## 2018-11-05 DIAGNOSIS — R627 Adult failure to thrive: Secondary | ICD-10-CM | POA: Diagnosis present

## 2018-11-05 DIAGNOSIS — E785 Hyperlipidemia, unspecified: Secondary | ICD-10-CM | POA: Diagnosis present

## 2018-11-05 DIAGNOSIS — Z79899 Other long term (current) drug therapy: Secondary | ICD-10-CM | POA: Diagnosis not present

## 2018-11-05 DIAGNOSIS — I10 Essential (primary) hypertension: Secondary | ICD-10-CM | POA: Diagnosis present

## 2018-11-05 DIAGNOSIS — A419 Sepsis, unspecified organism: Secondary | ICD-10-CM

## 2018-11-05 DIAGNOSIS — T68XXXD Hypothermia, subsequent encounter: Secondary | ICD-10-CM | POA: Diagnosis not present

## 2018-11-05 DIAGNOSIS — I5032 Chronic diastolic (congestive) heart failure: Secondary | ICD-10-CM | POA: Diagnosis present

## 2018-11-05 DIAGNOSIS — Z7401 Bed confinement status: Secondary | ICD-10-CM

## 2018-11-05 HISTORY — DX: Dysphagia following cerebral infarction: I69.391

## 2018-11-05 HISTORY — DX: Unspecified dementia, unspecified severity, without behavioral disturbance, psychotic disturbance, mood disturbance, and anxiety: F03.90

## 2018-11-05 HISTORY — DX: Unspecified diastolic (congestive) heart failure: I50.30

## 2018-11-05 HISTORY — DX: Type 2 diabetes mellitus without complications: E11.9

## 2018-11-05 HISTORY — DX: Unspecified atrial fibrillation: I48.91

## 2018-11-05 LAB — PROTIME-INR
INR: 0.9 (ref 0.8–1.2)
Prothrombin Time: 12.2 seconds (ref 11.4–15.2)

## 2018-11-05 LAB — CBC WITH DIFFERENTIAL/PLATELET
Abs Immature Granulocytes: 0.09 10*3/uL — ABNORMAL HIGH (ref 0.00–0.07)
Basophils Absolute: 0 10*3/uL (ref 0.0–0.1)
Basophils Relative: 0 %
Eosinophils Absolute: 0 10*3/uL (ref 0.0–0.5)
Eosinophils Relative: 0 %
HCT: 33.6 % — ABNORMAL LOW (ref 39.0–52.0)
Hemoglobin: 11 g/dL — ABNORMAL LOW (ref 13.0–17.0)
Immature Granulocytes: 1 %
Lymphocytes Relative: 11 %
Lymphs Abs: 0.9 10*3/uL (ref 0.7–4.0)
MCH: 29.8 pg (ref 26.0–34.0)
MCHC: 32.7 g/dL (ref 30.0–36.0)
MCV: 91.1 fL (ref 80.0–100.0)
Monocytes Absolute: 0.4 10*3/uL (ref 0.1–1.0)
Monocytes Relative: 5 %
Neutro Abs: 6.8 10*3/uL (ref 1.7–7.7)
Neutrophils Relative %: 83 %
Platelets: 285 10*3/uL (ref 150–400)
RBC: 3.69 MIL/uL — ABNORMAL LOW (ref 4.22–5.81)
RDW: 16.2 % — ABNORMAL HIGH (ref 11.5–15.5)
WBC: 8.2 10*3/uL (ref 4.0–10.5)
nRBC: 0 % (ref 0.0–0.2)

## 2018-11-05 LAB — COMPREHENSIVE METABOLIC PANEL
ALT: 26 U/L (ref 0–44)
AST: 28 U/L (ref 15–41)
Albumin: 3 g/dL — ABNORMAL LOW (ref 3.5–5.0)
Alkaline Phosphatase: 62 U/L (ref 38–126)
Anion gap: 13 (ref 5–15)
BUN: 15 mg/dL (ref 8–23)
CO2: 27 mmol/L (ref 22–32)
Calcium: 9.5 mg/dL (ref 8.9–10.3)
Chloride: 99 mmol/L (ref 98–111)
Creatinine, Ser: 1.05 mg/dL (ref 0.61–1.24)
GFR calc Af Amer: >60 mL/min (ref >60)
GFR calc non Af Amer: >60 mL/min (ref >60)
Glucose, Bld: 187 mg/dL — ABNORMAL HIGH (ref 70–99)
Potassium: 4.2 mmol/L (ref 3.5–5.1)
Sodium: 139 mmol/L (ref 135–145)
Total Bilirubin: 0.3 mg/dL (ref 0.3–1.2)
Total Protein: 6.8 g/dL (ref 6.5–8.1)

## 2018-11-05 LAB — SARS CORONAVIRUS 2 BY RT PCR (HOSPITAL ORDER, PERFORMED IN ~~LOC~~ HOSPITAL LAB): SARS Coronavirus 2: NEGATIVE

## 2018-11-05 LAB — APTT: aPTT: 31 seconds (ref 24–36)

## 2018-11-05 LAB — INFLUENZA PANEL BY PCR (TYPE A & B)
Influenza A By PCR: NEGATIVE
Influenza B By PCR: NEGATIVE

## 2018-11-05 LAB — TROPONIN I (HIGH SENSITIVITY): Troponin I (High Sensitivity): 23 ng/L — ABNORMAL HIGH (ref <18)

## 2018-11-05 LAB — LACTIC ACID, PLASMA: Lactic Acid, Venous: 3.8 mmol/L (ref 0.5–1.9)

## 2018-11-05 MED ORDER — GLYCOPYRROLATE 0.2 MG/ML IJ SOLN: 0.2000 mg | INTRAMUSCULAR | Status: DC | PRN

## 2018-11-05 MED ORDER — MORPHINE SULFATE (PF) 2 MG/ML IV SOLN: 1.0000 mg | INTRAVENOUS | Status: DC | PRN

## 2018-11-05 MED ORDER — HALOPERIDOL LACTATE 5 MG/ML IJ SOLN: 0.5000 mg | INTRAMUSCULAR | Status: DC | PRN

## 2018-11-05 MED ORDER — ONDANSETRON 4 MG PO TBDP: 4.0000 mg | ORAL_TABLET | Freq: Four times a day (QID) | ORAL | Status: DC | PRN

## 2018-11-05 MED ORDER — ACETAMINOPHEN 325 MG PO TABS: 650.0000 mg | ORAL_TABLET | Freq: Four times a day (QID) | ORAL | Status: DC | PRN

## 2018-11-05 MED ORDER — LORAZEPAM 2 MG/ML PO CONC: 1.0000 mg | ORAL | Status: DC | PRN

## 2018-11-05 MED ORDER — SODIUM CHLORIDE 0.9 % IV SOLN
2.0000 g | Freq: Once | INTRAVENOUS | Status: AC
  Administered 2018-11-05: 2 g via INTRAVENOUS
  Filled 2018-11-05: qty 20

## 2018-11-05 MED ORDER — BIOTENE DRY MOUTH MT LIQD: 15.0000 mL | OROMUCOSAL | Status: DC | PRN

## 2018-11-05 MED ORDER — SODIUM CHLORIDE 0.9 % IV BOLUS
1000.0000 mL | Freq: Once | INTRAVENOUS | Status: AC
  Administered 2018-11-05: 13:00:00 1000 mL via INTRAVENOUS

## 2018-11-05 MED ORDER — HALOPERIDOL LACTATE 2 MG/ML PO CONC
0.5000 mg | ORAL | Status: DC | PRN
  Filled 2018-11-05: qty 0.3

## 2018-11-05 MED ORDER — ONDANSETRON HCL 4 MG/2ML IJ SOLN: 4.0000 mg | Freq: Four times a day (QID) | INTRAMUSCULAR | Status: DC | PRN

## 2018-11-05 MED ORDER — HALOPERIDOL 0.5 MG PO TABS
0.5000 mg | ORAL_TABLET | ORAL | Status: DC | PRN
  Filled 2018-11-05: qty 1

## 2018-11-05 MED ORDER — ACETAMINOPHEN 650 MG RE SUPP: 650.0000 mg | Freq: Four times a day (QID) | RECTAL | Status: DC | PRN

## 2018-11-05 MED ORDER — SODIUM CHLORIDE 0.9 % IV SOLN: 500.0000 mg | Freq: Once | INTRAVENOUS | Status: DC

## 2018-11-05 MED ORDER — LORAZEPAM 2 MG/ML IJ SOLN: 1.0000 mg | INTRAMUSCULAR | Status: DC | PRN

## 2018-11-05 MED ORDER — LORAZEPAM 1 MG PO TABS: 1.0000 mg | ORAL_TABLET | ORAL | Status: DC | PRN

## 2018-11-05 MED ORDER — MORPHINE SULFATE (CONCENTRATE) 10 MG/0.5ML PO SOLN: 5.0000 mg | ORAL | Status: DC | PRN

## 2018-11-05 MED ORDER — GLYCOPYRROLATE 1 MG PO TABS
1.0000 mg | ORAL_TABLET | ORAL | Status: DC | PRN
  Filled 2018-11-05: qty 1

## 2018-11-05 MED ORDER — POLYVINYL ALCOHOL 1.4 % OP SOLN
1.0000 [drp] | Freq: Four times a day (QID) | OPHTHALMIC | Status: DC | PRN
  Filled 2018-11-05: qty 15

## 2018-11-05 NOTE — ED Notes (Signed)
Attempted to call report. No answer.

## 2018-11-05 NOTE — H&P (Signed)
History and Physical    Mason Webb. JIR:[id number] DOB: September 06, 1934 DOA: 11/05/2018  PCP: Burman Freestone, MD   Patient coming from: SNF; NOK: Daughter, Madelaine Etienne, [phone]    Chief Complaint: lethargy  HPI: Mason Webb. is a 84 y.o. male with medical history significant of advanced dementia; diastolic CHF; afib not on AC; DM; HTN; HLD: CAD; and hemorrhagic CVA presenting with lethargy.   He was admitted from 10/3-13 for acute metabolic encephalopathy associated with Proteus UTI and dehydration.  While previously home, he was discharged to SNF.  He had h/o chronic dysphagia with PEG tube that had been removed; palliative care met with family and they elected comfort feeds and no further G-tube placement.  He was made DNR with no aggressive measures, supportive care only.  He was previously living home with care from aides and family.  He has had intermittent episodes of unresponsiveness and had one today. "Totally out of it, blood pressure drops."  He has been hospitalized with these episodes before, and full evaluation has been unremarkable.  He has intermittent confusion, was able to feed himself (doesn't have teeth or dentures - must have soft or pureed), urinary incontinence, PT helping with ambulation.  For the past few months, he has deteriorated and needs help with ambulation.  He can get up with assistance and travel to the bathroom.  She is not opposed to comfort care only, episodes are more frequent, and she is interested in Hospice.  She would be in favor of residential hospice.  Paying the agency is "getting to be a lot of financial drain for me."  He absolutely refused to have the tube placed back.  He has been clear that he would prefer palliative care, comfort feeds.  She understands that he may aspirate, choke.    ED Course:   Recent UTI.  Here with unresponsiveness, hypoxia - mostly resolved.  Now on RA.  Currently altered, ?baseline.  Hypothermia, given  empiric abx.  UA pending.  CTA ordered.  Lactate up.   Review of Systems: Unable to perform   Ambulatory Status:  Currently non-ambulatory  Past Medical History:  Diagnosis Date  . Atrial fibrillation ([place])    not on AC  . CAD (coronary artery disease)   . Dementia ([place])   . Diastolic CHF ([place])   . DM (diabetes mellitus) ([place])   . Dysphagia as late effect of stroke   . Hemorrhagic stroke ([place])   . Hyperlipidemia   . Hypertension     Past Surgical History:  Procedure Laterality Date  . PEG PLACEMENT      Social History   Socioeconomic History  . Marital status: Married    Spouse name: Not on file  . Number of children: Not on file  . Years of education: Not on file  . Highest education level: Not on file  Occupational History  . Not on file  Social Needs  . Financial resource strain: Not on file  . Food insecurity    Worry: Not on file    Inability: Not on file  . Transportation needs    Medical: Not on file    Non-medical: Not on file  Tobacco Use  . Smoking status: Former Research scientist (life sciences)  . Smokeless tobacco: Never Used  Substance and Sexual Activity  . Alcohol use: Not Currently  . Drug use: Not on file  . Sexual activity: Not on file  Lifestyle  . Physical activity    Days per week: Not  on file    Minutes per session: Not on file  . Stress: Not on file  Relationships  . Social Herbalist on phone: Not on file    Gets together: Not on file    Attends religious service: Not on file    Active member of club or organization: Not on file    Attends meetings of clubs or organizations: Not on file    Relationship status: Not on file  . Intimate partner violence    Fear of current or ex partner: Not on file    Emotionally abused: Not on file    Physically abused: Not on file    Forced sexual activity: Not on file  Other Topics Concern  . Not on file  Social History Narrative  . Not on file    No Known Allergies  No family history on file.   Prior to Admission medications   Medication Sig Start Date End Date Taking? Authorizing Provider  allopurinol (ZYLOPRIM) 300 MG tablet Take 300 mg by mouth every morning. 07/20/18   [provider]  amLODipine (NORVASC) 2.5 MG tablet Take 2.5 mg by mouth every morning. 08/21/18   [provider]  aspirin EC 81 MG tablet Take 81 mg by mouth every morning.    [provider]  Cholecalciferol (VITAMIN D3 PO) Take 1,000 Units by mouth every morning.     [provider]  hydrALAZINE (APRESOLINE) 50 MG tablet Take 50 mg by mouth 2 (two) times daily. Hold for SBP <100 08/31/18   [provider]  losartan (COZAAR) 25 MG tablet Take 25 mg by mouth every morning. 08/03/18   [provider]  magnesium oxide (MAG-OX) 400 MG tablet Take 400 mg by mouth every morning. 07/20/18   [provider]  metoprolol succinate (TOPROL-XL) 25 MG 24 hr tablet Take 25 mg by mouth every morning. 10/10/18   [provider]  Nutritional Supplement LIQD Take 4 oz by mouth 2 (two) times daily. [employer]    [provider]  omeprazole (PRILOSEC) 40 MG capsule Take 40 mg by mouth every morning. 05/26/15   [provider]  pravastatin (PRAVACHOL) 10 MG tablet Take 10 mg by mouth at bedtime.    [provider]  vitamin B-12 (CYANOCOBALAMIN) 250 MCG tablet Take 250 mcg by mouth every morning.    [provider]    Physical Exam: Vitals:   11/05/18 1221 11/05/18 1228 11/05/18 1308  BP: 124/76    Pulse: 85    Resp: 18    Temp:  (!) 94.9 F (34.9 C)   TempSrc: Oral Rectal   SpO2: 95%    Weight:   79 kg  Height:   6' (1.829 m)     . General:  Appears calm and comfortable and is NAD . Eyes:  PERRL, barely opens to voice, normal lids with mild crusting . ENT:  grossly normal lips & tongue, mmm; increased upper airway secretions . Neck:  no LAD, masses or thyromegaly . Cardiovascular:  RRR, no m/r/g. 2-3+ LE edema, mostly pedal.   . Respiratory:   CTA bilaterally with no wheezes/rales/rhonchi.  Normal respiratory effort. . Abdomen:  soft, NT, ND, NABS . Skin:  no rash or induration seen on limited exam . Musculoskeletal:   no bony abnormality . Lower extremity:  Limited foot exam with no ulcerations.  2+ distal pulses. Marland Kitchen Psychiatric:  Very blunted mood and affect, speech slow but appropriate, AOx1 (knows his name  is Mason Webb, states he is "in the bed" when asked where he is and not able to be more specific) . Neurologic:  Unable to perform    Radiological Exams on Admission: Ct Head Wo Contrast  Result Date: 11/05/2018 CLINICAL DATA:  Altered level of consciousness.  Unresponsive. EXAM: CT HEAD WITHOUT CONTRAST TECHNIQUE: Contiguous axial images were obtained from the base of the skull through the vertex without intravenous contrast. COMPARISON:  MR brain 10/15/2018 FINDINGS: Brain: No evidence of acute infarction, hemorrhage, extra-axial collection, ventriculomegaly, or mass effect. Generalized cerebral atrophy. Periventricular white matter low attenuation likely secondary to microangiopathy. Vascular: Cerebrovascular atherosclerotic calcifications are noted. Skull: Negative for fracture or focal lesion. Sinuses/Orbits: Visualized portions of the orbits are unremarkable. Mastoid sinuses are clear. Small mucous retention cysts in the maxillary sinuses. Remainder the paranasal sinuses are clear. Other: None. IMPRESSION: 1. No acute intracranial pathology. 2. Chronic microvascular disease and cerebral atrophy. Electronically Signed   By: Kathreen Devoid   On: 11/05/2018 15:15   Dg Chest Port 1 View  Result Date: 11/05/2018 CLINICAL DATA:  Hypoxia, fatigue, dyspnea EXAM: PORTABLE CHEST 1 VIEW COMPARISON:  07/24/2018 chest radiograph. FINDINGS: Stable cardiomediastinal silhouette with normal heart size. No pneumothorax. No pleural effusion. Slightly low lung volumes. No pulmonary edema. No acute consolidative airspace disease.  IMPRESSION: Low lung volumes.  No active disease in the chest. Electronically Signed   By: Ilona Sorrel M.D.   On: 11/05/2018 13:35    EKG: Independently reviewed.  Afib with rate 88; LBBB with no evidence of acute ischemia   Labs on Admission: I have personally reviewed the available labs and imaging studies at the time of the admission.  Pertinent labs:   Glucose 187 Albumin 3.0 HS troponin 23 Lactate 3.8 WBC 8.2 Hgb 11.0 INR 0.9 Flu negative COVID pending   Assessment/Plan Principal Problem:   Hypothermia Active Problems:   Hypertension   Hyperlipidemia   Dysphagia   Adult failure to thrive    -Patient with recent admission for Proteus UTI -Presenting with AMS -Found to have hypothermia on evaluation with elevated lactate -Sepsis evaluation otherwise unremarkable -As noted during prior hospitalization, the patient has advanced dementia and has had progressive severe functional deterioration, appears to be approaching the end of life -He has known dysphagia and has refused repeat G-tube placement -He prefers comfort feeds and at this time comfort measures only -I discussed at length with his daughter, who is educated and intelligent; she clearly understands the situation and requests COMFORT CARE -Will discontinue home medications and current antibiotics and cancel CODE sepsis -Patient will be admitted to palliative care -He likely has been experiencing episodes associated with aspiration events, including today -He may have comfort feeds if preferred, knowing the risks -If he rallies and improves enough to consider d/c, would suggest SNF with hospice if he once again does not qualify for residential hospice - although it is clear that his daughter prefers residential hospice if this is an option -Comfort care order set utilized -No antibiotics or IVF as per family's request -Pain control with morphine as needed, would transition to a drip if needed but patient does  not appear to be in pain at this time    Note: This patient has been tested and is pending for the novel coronavirus COVID-19.    DVT prophylaxis: None - comfort measures Code Status: DNR - confirmed with family Family Communication: I spoke with his daughter by telephone Disposition Plan: In-hospital death vs. Residential hospice Consults called:  Palliative care Admission status: Admit - It is my clinical opinion that admission to INPATIENT is reasonable and necessary because of the expectation that this patient will require hospital care that crosses at least 2 midnights to treat this condition based on the medical complexity of the problems presented.  Given the aforementioned information, the predictability of an adverse outcome is felt to be significant.    Karmen Bongo MD [facility]   How to contact the [facility] Attending or Consulting provider [place] or covering provider during after hours [place], for this patient?  1. Check the care team in [facility] and look for a) attending/consulting [facility] provider listed and b) the [facility] Acute Care team listed 2. Log into [email or web address] and use [place]'s universal password to access. If you do not have the password, please contact the hospital operator. 3. Locate the [facility] provider you are looking for under [facility] and page to a number that you can be directly reached. 4. If you still have difficulty reaching the provider, please page the [facility] (Director on Call) for the Hospitalists listed on amion for assistance.   11/05/2018, 4:37 PM

## 2018-11-05 NOTE — ED Triage Notes (Signed)
Per [facility] pt coming from [facility] living rehab for lethargy and decreased responsiveness. EMS states initialy patient was nonarousable with oxygen sats 88% on RA. Placed on nonrebreather and patient became more alert. Patient hx of stroke leaving left sided deficits.

## 2018-11-05 NOTE — ED Provider Notes (Signed)
[facility]-EMERGENCY DEPT [facility] Emergency Department Provider Note MRN:  [id number]  Arrival date & time: 11/05/18     Chief Complaint   Altered Mental Status   History of Present Illness   Mason Cothron. is a 84 y.o. year-old male with a history of CAD, hemorrhagic stroke presenting to the ED with chief complaint of altered mental status.  Altered and hypoxic, coming from care facility, history of prior stroke with left-sided deficits.  I was unable to obtain an accurate HPI, PMH, or ROS due to the patient's altered mental status, dementia.  Level 5 caveat.  Review of Systems  Positive for altered mental status.  Patient's Health History    Past Medical History:  Diagnosis Date  . CAD (coronary artery disease)   . Hemorrhagic stroke ([facility])   . Hyperlipidemia   . Hypertension     Past Surgical History:  Procedure Laterality Date  . PEG PLACEMENT      No family history on file.  Social History   Socioeconomic History  . Marital status: Married    Spouse name: Not on file  . Number of children: Not on file  . Years of education: Not on file  . Highest education level: Not on file  Occupational History  . Not on file  Social Needs  . Financial resource strain: Not on file  . Food insecurity    Worry: Not on file    Inability: Not on file  . Transportation needs    Medical: Not on file    Non-medical: Not on file  Tobacco Use  . Smoking status: Former Games developer  . Smokeless tobacco: Never Used  Substance and Sexual Activity  . Alcohol use: Not Currently  . Drug use: Not on file  . Sexual activity: Not on file  Lifestyle  . Physical activity    Days per week: Not on file    Minutes per session: Not on file  . Stress: Not on file  Relationships  . Social Musician on phone: Not on file    Gets together: Not on file    Attends religious service: Not on file    Active member of club or organization: Not on file    Attends meetings of  clubs or organizations: Not on file    Relationship status: Not on file  . Intimate partner violence    Fear of current or ex partner: Not on file    Emotionally abused: Not on file    Physically abused: Not on file    Forced sexual activity: Not on file  Other Topics Concern  . Not on file  Social History Narrative  . Not on file     Physical Exam  Vital Signs and Nursing Notes reviewed Vitals:   11/05/18 1221 11/05/18 1228  BP: 124/76   Pulse: 85   Resp: 18   Temp:  (!) 94.9 F (34.9 C)  SpO2: 95%     CONSTITUTIONAL: Chronically ill-appearing, NAD NEURO: Somnolent, largely nonverbal, left-sided deficits EYES:  eyes equal and reactive ENT/NECK:  no LAD, no JVD CARDIO: Regular rate, well-perfused, normal S1 and S2 PULM:  CTAB no wheezing or rhonchi GI/GU:  normal bowel sounds, non-distended, non-tender MSK/SPINE:  No gross deformities, no edema SKIN:  no rash, atraumatic PSYCH:  Appropriate speech and behavior  Diagnostic and Interventional Summary    EKG Interpretation  Date/Time:  Sunday November 05 2018 12:23:10 EDT Ventricular Rate:  88 PR Interval:  QRS Duration: 122 QT Interval:  406 QTC Calculation: 457 R Axis:   51 Text Interpretation:  Atrial fibrillation Left bundle branch block Artifact in lead(s) I II III aVR aVL V2 V5 Confirmed by Kennis CarinaBero,  [phone]([id number]) on 11/05/2018 12:30:37 PM      Labs Reviewed  LACTIC ACID, PLASMA - Abnormal; Notable for the following components:      Result Value   Lactic Acid, Venous 3.8 (*)    All other components within normal limits  COMPREHENSIVE METABOLIC PANEL - Abnormal; Notable for the following components:   Glucose, Bld 187 (*)    Albumin 3.0 (*)    All other components within normal limits  CBC WITH DIFFERENTIAL/PLATELET - Abnormal; Notable for the following components:   RBC 3.69 (*)    Hemoglobin 11.0 (*)    HCT 33.6 (*)    RDW 16.2 (*)    Abs Immature Granulocytes 0.09 (*)    All other components within  normal limits  TROPONIN I (HIGH SENSITIVITY) - Abnormal; Notable for the following components:   Troponin I (High Sensitivity) 23 (*)    All other components within normal limits  CULTURE, BLOOD (ROUTINE X 2)  CULTURE, BLOOD (ROUTINE X 2)  URINE CULTURE  SARS CORONAVIRUS 2 (TAT 6-24 HRS)  APTT  PROTIME-INR  LACTIC ACID, PLASMA  URINALYSIS, ROUTINE W REFLEX MICROSCOPIC  INFLUENZA PANEL BY PCR (TYPE A & B)  TROPONIN I (HIGH SENSITIVITY)    DG Chest Port 1 View  Final Result    CT Head Wo Contrast    (Results Pending)    Medications  azithromycin (ZITHROMAX) 500 mg in sodium chloride 0.9 % 250 mL IVPB (has no administration in time range)  cefTRIAXone (ROCEPHIN) 2 g in sodium chloride 0.9 % 100 mL IVPB ( Intravenous Stopped 11/05/18 1356)  sodium chloride 0.9 % bolus 1,000 mL (1,000 mLs Intravenous New Bag/Given 11/05/18 1322)     Ultrasound ED Peripheral IV (Provider)  Date/Time: 11/05/2018 1:17 PM Performed by: Sabas SousBero,  M, MD Authorized by: Sabas SousBero,  M, MD   Procedure details:    Indications: multiple failed IV attempts     Skin Prep: chlorhexidine gluconate     Location: Left brachial vein.   Angiocath:  20 G   Bedside Ultrasound Guided: Yes     Patient tolerated procedure without complications: Yes     Dressing applied: Yes    .Critical Care Performed by: Sabas SousBero,  M, MD Authorized by: Sabas SousBero,  M, MD   Critical care provider statement:    Critical care time (minutes):  35   Critical care was necessary to treat or prevent imminent or life-threatening deterioration of the following conditions:  Sepsis   Critical care was time spent personally by me on the following activities:  Discussions with consultants, evaluation of patient's response to treatment, examination of patient, ordering and performing treatments and interventions, ordering and review of laboratory studies, ordering and review of radiographic studies, pulse oximetry, re-evaluation of  patient's condition, obtaining history from patient or surrogate and review of old charts   Critical Care  ED Course and Medical Decision Making  I have reviewed the triage vital signs and the nursing notes.  Pertinent labs & imaging results that were available during my care of the patient were reviewed by me and considered in my medical decision making (see below for details).  Altered mental status, hypothermia, report of hypoxia, requiring new oxygen requirement, code sepsis initiated, providing empiric fluid antibiotics, work-up pending.  Lactate  elevated, chest x-ray without acute process.  Still awaiting urinalysis.  CT head still to be read by radiologist but no obvious bleeding on my read.  Will admit to hospitalist service.  Barth Kirks. Sedonia Small, MD [place] [email or web address]  Final Clinical Impressions(s) / ED Diagnoses     ICD-10-CM   1. Sepsis, due to unspecified organism, unspecified whether acute organ dysfunction present ([place])  A41.9   2. Altered mental status, unspecified altered mental status type  R41.82     ED Discharge Orders    None      Discharge Instructions Discussed with and Provided to Patient: Discharge Instructions   None       Maudie Flakes, MD 11/05/18 1513

## 2018-11-05 NOTE — Progress Notes (Signed)
Notified bedside nurse of need to draw repeat lactic acid. 

## 2018-11-05 NOTE — ED Notes (Signed)
Patient transported to CT 

## 2018-11-05 NOTE — Progress Notes (Signed)
Notified bedside nurse of need to draw repeat lactic acid @ 1506.

## 2018-11-05 NOTE — ED Notes (Signed)
Attempted to call report

## 2018-11-06 DIAGNOSIS — Z7189 Other specified counseling: Secondary | ICD-10-CM

## 2018-11-06 DIAGNOSIS — T68XXXD Hypothermia, subsequent encounter: Secondary | ICD-10-CM

## 2018-11-06 DIAGNOSIS — Z515 Encounter for palliative care: Secondary | ICD-10-CM

## 2018-11-06 LAB — BLOOD CULTURE ID PANEL (REFLEXED)

## 2018-11-06 LAB — MRSA PCR SCREENING: MRSA by PCR: NEGATIVE

## 2018-11-06 NOTE — Progress Notes (Signed)
PHARMACY - PHYSICIAN COMMUNICATION CRITICAL VALUE ALERT - BLOOD CULTURE IDENTIFICATION (BCID)  Mason Webb. is an 84 y.o. male who presented to [facility] on 11/05/2018 with a chief complaint of AMS/lethargy, advanced dementia, now under [place] for end of life care.  Assessment:  2/2 blood cultures growing MR-CoNS  Name of physician (or Provider) Contacted: B Kyere  Current antibiotics: None   Changes to prescribed antibiotics recommended:  None.  Confirmed with provider--no antibiotics needed at this time.  Results for orders placed or performed during the hospital encounter of 11/05/18  Blood Culture ID Panel (Reflexed) (Collected: 11/05/2018  9:29 PM)  Result Value Ref Range   Enterococcus species NOT DETECTED NOT DETECTED   Listeria monocytogenes NOT DETECTED NOT DETECTED   Staphylococcus species DETECTED (A) NOT DETECTED   Staphylococcus aureus (BCID) NOT DETECTED NOT DETECTED   Methicillin resistance DETECTED (A) NOT DETECTED   Streptococcus species NOT DETECTED NOT DETECTED   Streptococcus agalactiae NOT DETECTED NOT DETECTED   Streptococcus pneumoniae NOT DETECTED NOT DETECTED   Streptococcus pyogenes NOT DETECTED NOT DETECTED   Acinetobacter baumannii NOT DETECTED NOT DETECTED   Enterobacteriaceae species NOT DETECTED NOT DETECTED   Enterobacter cloacae complex NOT DETECTED NOT DETECTED   Escherichia coli NOT DETECTED NOT DETECTED   Klebsiella oxytoca NOT DETECTED NOT DETECTED   Klebsiella pneumoniae NOT DETECTED NOT DETECTED   Proteus species NOT DETECTED NOT DETECTED   Serratia marcescens NOT DETECTED NOT DETECTED   Haemophilus influenzae NOT DETECTED NOT DETECTED   Neisseria meningitidis NOT DETECTED NOT DETECTED   Pseudomonas aeruginosa NOT DETECTED NOT DETECTED   Candida albicans NOT DETECTED NOT DETECTED   Candida glabrata NOT DETECTED NOT DETECTED   Candida krusei NOT DETECTED NOT DETECTED   Candida parapsilosis NOT DETECTED NOT DETECTED   Candida tropicalis NOT DETECTED NOT DETECTED    Caryl Pina 11/06/2018  11:30 PM

## 2018-11-06 NOTE — Progress Notes (Signed)
Nutrition Brief Note  Chart reviewed. Pt now transitioning to comfort care.  No further nutrition interventions warranted at this time.  Please re-consult as needed.   Kellan Boehlke A. Hope Brandenburger, RD, LDN, CDCES Registered Dietitian II Certified Diabetes Care and Education Specialist Pager: [phone] After hours Pager: [phone]  

## 2018-11-06 NOTE — Social Work (Signed)
CSW acknowledging consult for comfort care. Per PMT preference is for [facility] or [facility]. CSW will make referral to see if either has an available bed today.   Alexander Mt, [place] Work [phone]

## 2018-11-06 NOTE — Plan of Care (Signed)
  Problem: Clinical Measurements: Goal: Quality of life will improve Outcome: Progressing   

## 2018-11-06 NOTE — Progress Notes (Signed)
Patient received from ED to 6N21. Alert and oriented x1. Vital signs normal. Denies any c/o pain. Will continiue to monitor.

## 2018-11-06 NOTE — Progress Notes (Signed)
PHARMACY - PHYSICIAN COMMUNICATION CRITICAL VALUE ALERT - BLOOD CULTURE IDENTIFICATION (BCID)  Mason Webb. is an 84 y.o. male who presented to [facility] on 11/05/2018 with a chief complaint of AMS  Assessment:  1/4 bottles positive with [facility]  Name of physician (or Provider) Contacted: Dr. Arbutus Ped  Current antibiotics: None  Changes to prescribed antibiotics recommended:  None - likely contaminant and patient transitioning to hospice  No results found for this or any previous visit.  Mason Webb 11/06/2018  12:12 PM

## 2018-11-06 NOTE — Progress Notes (Signed)
PROGRESS NOTE    Mason Webb.  OHF:[id number] DOB: 17-Apr-1934 DOA: 11/05/2018  PCP: Burman Freestone, MD    LOS - 1   Brief Narrative: 84 y.o.malewith medical history significant ofadvanced dementia; diastolic CHF; afib not on AC; DM; HTN; HLD: CAD; and hemorrhagic CVA presenting with lethargy.  He was admitted from 10/3-13 for acute metabolic encephalopathy associated with Proteus UTI and dehydration. While previously home, he was discharged to SNF. He had h/o chronic dysphagia with PEG tube that had been removed; palliative care met with family and they elected comfort feeds and no further G-tube placement. He was made DNR with no aggressive measures, supportive care only.  He was previously living home with care from aides and family. He has had intermittent episodes of unresponsiveness and had one today. "Totally out of it, blood pressure drops." He has been hospitalized with these episodes before, and full evaluation has been unremarkable. He has intermittent confusion.  For the past few months, he has deteriorated and needs help with ambulation, can get up with assistance to the bathroom.  Goals of care discussed with patient's daughter upon admission and with palliative team.  If was decided to continue full comfort measures with PRN medications for comfort purposes only, and referral to hospice facility.  Family requests patient be able to eat if he is interested, understanding risk of aspiration, would prefer he be able to eat for pleasure.  Subjective 10/26: Patient seen sleeping comfortably in bed, awoke fairly easily.  Appears in no obvious distress.  Unable to provide any history or report any symptoms.  No acute events reported overnight  Assessment & Plan:   Principal Problem:   Hypothermia Active Problems:   Hypertension   Hyperlipidemia   Dysphagia   Adult failure to thrive   Goals of care, counseling/discussion   Comfort measures only status    Hypothermia Comfort Care Status  -Medications and antibiotics discontinued -Palliative on board -Comfort feeds, family aware of aspiration risk  -Per family, no antibiotics or IV fluids -Pain control with as needed morphine -As needed Ativan for anxiety and Haldol for delirium or agitation -As needed Tylenol for mild pain or fevers   COVID-19 negative   DVT prophylaxis: None, Comfort Care   Code Status: DNR  Family Communication: None present at bedside Disposition Plan: To [facility] hospice pending available bed, expect tomorrow 10/27   Consultants:   Palliative  Procedures:   None  Antimicrobials:   None   Objective: Vitals:   11/05/18 1700 11/05/18 1715 11/05/18 1730 11/05/18 2104  BP: (!) 148/99 (!) 155/111 (!) 157/96 137/90  Pulse: 92 90 91 92  Resp: (!) _0 Temp:    98.5 F (36.9 C)  TempSrc:    Oral  SpO2: 94% 94% 95% 100%  Weight:      Height:        Intake/Output Summary (Last 24 hours) at 11/06/2018 1512 Last data filed at 11/06/2018 1422 Gross per 24 hour  Intake 0 ml  Output 300 ml  Net -300 ml   Filed Weights   11/05/18 1308  Weight: 79 kg    Examination:  General exam: Sleeping comfortably, no acute distress Respiratory system: clear to auscultation anteriorly, no wheezes, rales or rhonchi, normal respiratory effort. Cardiovascular system: normal S1/S2,  RRR, no pedal edema.   Gastrointestinal system: soft, non-tender, non-distended abdomen, no organomegaly or masses felt, normal bowel sounds. Skin: dry, intact, normal temperature    Data Reviewed: I  have personally reviewed following labs and imaging studies  CBC: Recent Labs  Lab 11/05/18 1316  WBC 8.2  NEUTROABS 6.8  HGB 11.0*  HCT 33.6*  MCV 91.1  PLT 294   Basic Metabolic Panel: Recent Labs  Lab 11/05/18 1316  NA 139  K 4.2  CL 99  CO2 27  GLUCOSE 187*  BUN 15  CREATININE 1.05  CALCIUM 9.5   GFR: Estimated Creatinine Clearance: 59.5 mL/min  (by C-G formula based on SCr of 1.05 mg/dL). Liver Function Tests: Recent Labs  Lab 11/05/18 1316  AST 28  ALT 26  ALKPHOS 62  BILITOT 0.3  PROT 6.8  ALBUMIN 3.0*   No results for input(s): LIPASE, AMYLASE in the last 168 hours. No results for input(s): AMMONIA in the last 168 hours. Coagulation Profile: Recent Labs  Lab 11/05/18 1316  INR 0.9   Cardiac Enzymes: No results for input(s): CKTOTAL, CKMB, CKMBINDEX, TROPONINI in the last 168 hours. BNP (last 3 results) No results for input(s): PROBNP in the last 8760 hours. HbA1C: No results for input(s): HGBA1C in the last 72 hours. CBG: No results for input(s): GLUCAP in the last 168 hours. Lipid Profile: No results for input(s): CHOL, HDL, LDLCALC, TRIG, CHOLHDL, LDLDIRECT in the last 72 hours. Thyroid Function Tests: No results for input(s): TSH, T4TOTAL, FREET4, T3FREE, THYROIDAB in the last 72 hours. Anemia Panel: No results for input(s): VITAMINB12, FOLATE, FERRITIN, TIBC, IRON, RETICCTPCT in the last 72 hours. Sepsis Labs: Recent Labs  Lab 11/05/18 1317  LATICACIDVEN 3.8*    Recent Results (from the past 240 hour(s))  Blood Culture (routine x 2)     Status: None (Preliminary result)   Collection Time: 11/05/18  1:08 PM   Specimen: BLOOD LEFT ARM  Result Value Ref Range Status   Specimen Description BLOOD LEFT ARM  Final   Special Requests   Final    BOTTLES DRAWN AEROBIC AND ANAEROBIC Blood Culture adequate volume   Culture  Setup Time   Final    GRAM POSITIVE COCCI IN CLUSTERS IN BOTH AEROBIC AND ANAEROBIC BOTTLES CRITICAL RESULT CALLED TO, READ BACK BY AND VERIFIED WITH: PHARMD T BAUMEISTER [id number] AT 0354 BY CM    Culture   Final    CULTURE REINCUBATED FOR BETTER GROWTH Performed at [facility], [place] [address] [place], [place] [address]    Report Status PENDING  Incomplete  SARS Coronavirus 2 by RT PCR (hospital order, performed in [place] hospital lab)     Status: None   Collection Time:  11/05/18  1:34 PM  Result Value Ref Range Status   SARS Coronavirus 2 NEGATIVE NEGATIVE Final    Comment: (NOTE) If result is NEGATIVE SARS-CoV-2 target nucleic acids are NOT DETECTED. The SARS-CoV-2 RNA is generally detectable in upper and lower  respiratory specimens during the acute phase of infection. The lowest  concentration of SARS-CoV-2 viral copies this assay can detect is 250  copies / mL. A negative result does not preclude SARS-CoV-2 infection  and should not be used as the sole basis for treatment or other  patient management decisions.  A negative result may occur with  improper specimen collection / handling, submission of specimen other  than nasopharyngeal swab, presence of viral mutation(s) within the  areas targeted by this assay, and inadequate number of viral copies  (<250 copies / mL). A negative result must be combined with clinical  observations, patient history, and epidemiological information. If result is POSITIVE SARS-CoV-2 target  nucleic acids are DETECTED. The SARS-CoV-2 RNA is generally detectable in upper and lower  respiratory specimens dur ing the acute phase of infection.  Positive  results are indicative of active infection with SARS-CoV-2.  Clinical  correlation with patient history and other diagnostic information is  necessary to determine patient infection status.  Positive results do  not rule out bacterial infection or co-infection with other viruses. If result is PRESUMPTIVE POSTIVE SARS-CoV-2 nucleic acids MAY BE PRESENT.   A presumptive positive result was obtained on the submitted specimen  and confirmed on repeat testing.  While 2019 novel coronavirus  (SARS-CoV-2) nucleic acids may be present in the submitted sample  additional confirmatory testing may be necessary for epidemiological  and / or clinical management purposes  to differentiate between  SARS-CoV-2 and other Sarbecovirus currently known to infect humans.  If clinically  indicated additional testing with an alternate test  methodology [phone]) is advised. The SARS-CoV-2 RNA is generally  detectable in upper and lower respiratory sp ecimens during the acute  phase of infection. The expected result is Negative. Fact Sheet for Patients:  StrictlyIdeas.no Fact Sheet for Healthcare Providers: BankingDealers.co.za This test is not yet approved or cleared by the [place] FDA and has been authorized for detection and/or diagnosis of SARS-CoV-2 by FDA under an Emergency Use Authorization (EUA).  This EUA will remain in effect (meaning this test can be used) for the duration of the COVID-19 declaration under Section 564(b)(1) of the Act, 21 U.S.C. section 360bbb-3(b)(1), unless the authorization is terminated or revoked sooner. Performed at [facility], [place] [address], [place], [place] [address]   Blood Culture (routine x 2)     Status: None (Preliminary result)   Collection Time: 11/05/18  9:29 PM   Specimen: BLOOD LEFT HAND  Result Value Ref Range Status   Specimen Description BLOOD LEFT HAND  Final   Special Requests   Final    BOTTLES DRAWN AEROBIC ONLY Blood Culture adequate volume   Culture   Final    NO GROWTH < 12 HOURS Performed at [facility], [place] [address], [place], [place] [address]    Report Status PENDING  Incomplete  MRSA PCR Screening     Status: None   Collection Time: 11/06/18  1:56 AM   Specimen: Nasopharyngeal  Result Value Ref Range Status   MRSA by PCR NEGATIVE NEGATIVE Final    Comment:        The GeneXpert MRSA Assay (FDA approved for NASAL specimens only), is one component of a comprehensive MRSA colonization surveillance program. It is not intended to diagnose MRSA infection nor to guide or monitor treatment for MRSA infections. Performed at [facility], [place] [address] [place],  [address]          Radiology Studies: Ct Head Wo  Contrast  Result Date: 11/05/2018 CLINICAL DATA:  Altered level of consciousness.  Unresponsive. EXAM: CT HEAD WITHOUT CONTRAST TECHNIQUE: Contiguous axial images were obtained from the base of the skull through the vertex without intravenous contrast. COMPARISON:  MR brain 10/15/2018 FINDINGS: Brain: No evidence of acute infarction, hemorrhage, extra-axial collection, ventriculomegaly, or mass effect. Generalized cerebral atrophy. Periventricular white matter low attenuation likely secondary to microangiopathy. Vascular: Cerebrovascular atherosclerotic calcifications are noted. Skull: Negative for fracture or focal lesion. Sinuses/Orbits: Visualized portions of the orbits are unremarkable. Mastoid sinuses are clear. Small mucous retention cysts in the maxillary sinuses. Remainder the paranasal sinuses are clear. Other: None. IMPRESSION: 1.  No acute intracranial pathology. 2. Chronic microvascular disease and cerebral atrophy. Electronically Signed   By: Kathreen Devoid   On: 11/05/2018 15:15   Dg Chest Port 1 View  Result Date: 11/05/2018 CLINICAL DATA:  Hypoxia, fatigue, dyspnea EXAM: PORTABLE CHEST 1 VIEW COMPARISON:  07/24/2018 chest radiograph. FINDINGS: Stable cardiomediastinal silhouette with normal heart size. No pneumothorax. No pleural effusion. Slightly low lung volumes. No pulmonary edema. No acute consolidative airspace disease. IMPRESSION: Low lung volumes.  No active disease in the chest. Electronically Signed   By: Ilona Sorrel M.D.   On: 11/05/2018 13:35        Scheduled Meds: Continuous Infusions:   LOS: 1 day    Time spent: 15-20 min    Ezekiel Slocumb, DO [facility] Pager: [phone]  If 7PM-7AM, please contact night-coverage [email or web address] Password [email or web address] 11/06/2018, 3:12 PM

## 2018-11-06 NOTE — Discharge Summary (Addendum)
Physician Discharge Summary  Mason Webb. EHM:[id number] DOB: December 30, 1934 DOA: 11/05/2018  PCP: Burman Freestone, MD  Admit date: 11/05/2018 Discharge date: 11/07/2018  (bed not yet available 10/26)  Admitted From: SNF Disposition: [facility]  Recommendations for Outpatient Follow-up:  1. Per hospice  Home Health: No Equipment/Devices: None  Discharge Condition: Hospice  CODE STATUS: Comfort care  Diet recommendation: Pleasure feeds  Brief/Interim Summary:  84 y.o. male with medical history significant of advanced dementia; diastolic CHF; afib not on AC; DM; HTN; HLD: CAD; and hemorrhagic CVA presenting with lethargy.  He was admitted from 10/3-13 for acute metabolic encephalopathy associated with Proteus UTI and dehydration.  While previously home, he was discharged to SNF.  He had h/o chronic dysphagia with PEG tube that had been removed; palliative care met with family and they elected comfort feeds and no further G-tube placement.  He was made DNR with no aggressive measures, supportive care only.  He was previously living home with care from aides and family.  He has had intermittent episodes of unresponsiveness and had one today. "Totally out of it, blood pressure drops."  He has been hospitalized with these episodes before, and full evaluation has been unremarkable.  He has intermittent confusion.  For the past few months, he has deteriorated and needs help with ambulation, can get up with assistance to the bathroom.   Goals of care discussed with patient's daughter upon admission and with palliative team.  If was decided to continue full comfort measures with PRN medications for comfort purposes only, and referral to hospice facility.  Family requests patient be able to eat if he is interested, understanding risk of aspiration, would prefer he be able to eat for pleasure.     Discharge Diagnoses: Principal Problem:   Hypothermia Active Problems:   Hypertension    Hyperlipidemia   Dysphagia   Adult failure to thrive   Goals of care, counseling/discussion   Comfort measures only status    Discharge Instructions   Discharge Instructions    Diet - low sodium heart healthy   Complete by: As directed    Increase activity slowly   Complete by: As directed      Allergies as of 11/06/2018   No Known Allergies     Medication List    STOP taking these medications   allopurinol 300 MG tablet Commonly known as: ZYLOPRIM   amLODipine 2.5 MG tablet Commonly known as: NORVASC   aspirin EC 81 MG tablet   hydrALAZINE 50 MG tablet Commonly known as: APRESOLINE   losartan 25 MG tablet Commonly known as: COZAAR   magnesium oxide 400 MG tablet Commonly known as: MAG-OX   metoprolol succinate 25 MG 24 hr tablet Commonly known as: TOPROL-XL   Nutritional Supplement Liqd   omeprazole 40 MG capsule Commonly known as: PRILOSEC   pravastatin 10 MG tablet Commonly known as: PRAVACHOL   vitamin B-12 250 MCG tablet Commonly known as: CYANOCOBALAMIN   VITAMIN D3 PO       No Known Allergies  Consultations:  Palliative    Procedures/Studies: Ct Head Wo Contrast  Result Date: 11/05/2018 CLINICAL DATA:  Altered level of consciousness.  Unresponsive. EXAM: CT HEAD WITHOUT CONTRAST TECHNIQUE: Contiguous axial images were obtained from the base of the skull through the vertex without intravenous contrast. COMPARISON:  MR brain 10/15/2018 FINDINGS: Brain: No evidence of acute infarction, hemorrhage, extra-axial collection, ventriculomegaly, or mass effect. Generalized cerebral atrophy. Periventricular white matter low attenuation likely secondary to microangiopathy.  Vascular: Cerebrovascular atherosclerotic calcifications are noted. Skull: Negative for fracture or focal lesion. Sinuses/Orbits: Visualized portions of the orbits are unremarkable. Mastoid sinuses are clear. Small mucous retention cysts in the maxillary sinuses. Remainder the  paranasal sinuses are clear. Other: None. IMPRESSION: 1. No acute intracranial pathology. 2. Chronic microvascular disease and cerebral atrophy. Electronically Signed   By: Kathreen Devoid   On: 11/05/2018 15:15   Mr Angio Head Wo Contrast  Result Date: 10/15/2018 CLINICAL DATA:  Encephalopathy EXAM: MRI HEAD WITHOUT CONTRAST MRA HEAD WITHOUT CONTRAST TECHNIQUE: Multiplanar, multiecho pulse sequences of the brain and surrounding structures were obtained without intravenous contrast. Angiographic images of the head were obtained using MRA technique without contrast. COMPARISON:  Head CT 10/14/2018 FINDINGS: MRI HEAD FINDINGS BRAIN: There is no acute infarct, acute hemorrhage or extra-axial collection. Early confluent hyperintense T2-weighted signal of the periventricular and deep white matter, most commonly due to chronic ischemic microangiopathy. Generalized advanced atrophy. No hydrocephalus. The midline structures are normal. There are multiple old small vessel infarcts. VASCULAR: The major intracranial arterial and venous sinus flow voids are normal. Susceptibility weighted imaging shows multiple old microhemorrhages as well as sequelae of prior hemorrhagic events in the deep gray nuclei. SKULL AND UPPER CERVICAL SPINE: Calvarial bone marrow signal is normal. There is no skull base mass. The visualized upper cervical spine and soft tissues are normal. SINUSES/ORBITS: There are no fluid levels or advanced mucosal thickening. The mastoid air cells and middle ear cavities are free of fluid. The orbits are normal. MRA HEAD FINDINGS POSTERIOR CIRCULATION: --Vertebral arteries: Normal V4 segments. --Posterior inferior cerebellar arteries (PICA): Patent origins from the vertebral arteries. --Anterior inferior cerebellar arteries (AICA): Patent origins from the basilar artery. --Basilar artery: Normal. --Superior cerebellar arteries: Normal. --Posterior cerebral arteries: Normal. The left PCA is partially supplied by a  posterior communicating artery (p-comm). ANTERIOR CIRCULATION: --Intracranial internal carotid arteries: Normal. --Anterior cerebral arteries (ACA): Normal. Both A1 segments are present. Patent anterior communicating artery (a-comm). --Middle cerebral arteries (MCA): Normal. IMPRESSION: 1. No acute intracranial abnormality. 2. Generalized advanced atrophy and chronic ischemic microangiopathy. 3. Multiple old infarcts and chronic microhemorrhages. 4. Normal MRA of the Circle of Willis. Electronically Signed   By: Ulyses Jarred M.D.   On: 10/15/2018 01:20   Mr Brain Wo Contrast  Result Date: 10/15/2018 CLINICAL DATA:  Encephalopathy EXAM: MRI HEAD WITHOUT CONTRAST MRA HEAD WITHOUT CONTRAST TECHNIQUE: Multiplanar, multiecho pulse sequences of the brain and surrounding structures were obtained without intravenous contrast. Angiographic images of the head were obtained using MRA technique without contrast. COMPARISON:  Head CT 10/14/2018 FINDINGS: MRI HEAD FINDINGS BRAIN: There is no acute infarct, acute hemorrhage or extra-axial collection. Early confluent hyperintense T2-weighted signal of the periventricular and deep white matter, most commonly due to chronic ischemic microangiopathy. Generalized advanced atrophy. No hydrocephalus. The midline structures are normal. There are multiple old small vessel infarcts. VASCULAR: The major intracranial arterial and venous sinus flow voids are normal. Susceptibility weighted imaging shows multiple old microhemorrhages as well as sequelae of prior hemorrhagic events in the deep gray nuclei. SKULL AND UPPER CERVICAL SPINE: Calvarial bone marrow signal is normal. There is no skull base mass. The visualized upper cervical spine and soft tissues are normal. SINUSES/ORBITS: There are no fluid levels or advanced mucosal thickening. The mastoid air cells and middle ear cavities are free of fluid. The orbits are normal. MRA HEAD FINDINGS POSTERIOR CIRCULATION: --Vertebral arteries:  Normal V4 segments. --Posterior inferior cerebellar arteries (PICA): Patent origins from the vertebral  arteries. --Anterior inferior cerebellar arteries (AICA): Patent origins from the basilar artery. --Basilar artery: Normal. --Superior cerebellar arteries: Normal. --Posterior cerebral arteries: Normal. The left PCA is partially supplied by a posterior communicating artery (p-comm). ANTERIOR CIRCULATION: --Intracranial internal carotid arteries: Normal. --Anterior cerebral arteries (ACA): Normal. Both A1 segments are present. Patent anterior communicating artery (a-comm). --Middle cerebral arteries (MCA): Normal. IMPRESSION: 1. No acute intracranial abnormality. 2. Generalized advanced atrophy and chronic ischemic microangiopathy. 3. Multiple old infarcts and chronic microhemorrhages. 4. Normal MRA of the Circle of Willis. Electronically Signed   By: Ulyses Jarred M.D.   On: 10/15/2018 01:20   Dg Chest Port 1 View  Result Date: 11/05/2018 CLINICAL DATA:  Hypoxia, fatigue, dyspnea EXAM: PORTABLE CHEST 1 VIEW COMPARISON:  07/24/2018 chest radiograph. FINDINGS: Stable cardiomediastinal silhouette with normal heart size. No pneumothorax. No pleural effusion. Slightly low lung volumes. No pulmonary edema. No acute consolidative airspace disease. IMPRESSION: Low lung volumes.  No active disease in the chest. Electronically Signed   By: Ilona Sorrel M.D.   On: 11/05/2018 13:35   Dg Chest Port 1 View  Result Date: 10/14/2018 CLINICAL DATA:  84 year old male with history of weakness. Left-sided neglect and left facial droop. EXAM: PORTABLE CHEST 1 VIEW COMPARISON:  Chest x-ray 07/24/2018. FINDINGS: Lung volumes are low. No consolidative airspace disease. No pleural effusions. No pneumothorax. No pulmonary nodule or mass noted. Pulmonary vasculature and the cardiomediastinal silhouette are within normal limits. IMPRESSION: 1. Low lung volumes without radiographic evidence acute cardiopulmonary disease.  Electronically Signed   By: Vinnie Langton M.D.   On: 10/14/2018 20:44   Dg Swallowing Func-speech Pathology  Result Date: 10/17/2018 Objective Swallowing Evaluation: Type of Study: MBS-Modified Barium Swallow Study  Patient Details Name: Hamish Banks. MRN: [id number] Date of Birth: 1934/11/21 Today's Date: 10/17/2018 Time: SLP Start Time (ACUTE ONLY): 2297 -SLP Stop Time (ACUTE ONLY): 0859 SLP Time Calculation (min) (ACUTE ONLY): 24 min Past Medical History: Past Medical History: Diagnosis Date . CAD (coronary artery disease)  . Hemorrhagic stroke ([place])  . Hyperlipidemia  . Hypertension  Past Surgical History: Past Surgical History: Procedure Laterality Date . PEG PLACEMENT   HPI: 84 yo male adm to [facility] with AMS - MRI negative except for old gangionic, left thalamic and caudate cva.  Pt cxr showed low lung volumes.  Pt reportedly resides with his wife.  He choked on pills. Per chart review, pt has h/o PEG tube placement- ? indication?  Subjective: pt awake in chair Assessment / Plan / Recommendation CHL IP CLINICAL IMPRESSIONS 10/17/2018 Clinical Impression The patient presents with moderate oral dysphagia characterized by delayed oral transit, lingual pumping and premature spill.  Severe Pharyngeal dysphagia with decreased epiglottic deflection, decreased hyo-laryngeal excursion, little to no pharyngeal constriction resulting in gross vallecula and moderate pyriform residue (that mix with secretions). The patient was observed to swallow multiple times inconsistently in attempt to clear the pharyngeal residue and generally was not successful. Head turn right did not decrease accumulation in pharynx. Cued "Hock" was not adequate and thus no residual was removed.  Cricopharyngeal opening appeared to be inadequate.  Pt with gross retention (mixed with secretions) in phayrnx without consistent sensation nor ability to clear.  Laryngeal penetration noted with all liquids with pt demonstrating reflexive throat  clear but this did not successfully clear material from the laryngeal vestibule.  Given h/o dysphagia requiring PEG placement, suspect this is an exacerbation of baseline difficulties. Given pt's advanced age, dementia and acute on chronic dysphagia (dating  back to at least 2016) - recommend to consider a palliative consult to establish goals of care.  Pt admits he is losing weight and has been coughing with intake prior to admit.  Rec npo except tsps water and ice chips at this time. Marland Kitchen SLP Visit Diagnosis Dysphagia, oropharyngeal phase (R13.12);Dysphagia, pharyngoesophageal phase (R13.14) Attention and concentration deficit following -- Frontal lobe and executive function deficit following -- Impact on safety and function Severe aspiration risk;Risk for inadequate nutrition/hydration   CHL IP TREATMENT RECOMMENDATION 10/17/2018 Treatment Recommendations Therapy as outlined in treatment plan below   Prognosis 10/17/2018 Prognosis for Safe Diet Advancement Guarded Barriers to Reach Goals Severity of deficits;Other (Comment) Barriers/Prognosis Comment premorbid deficits dating back to at least 2016 - pt underwent mbs 2017 with recommendation for npo CHL IP DIET RECOMMENDATION 10/17/2018 SLP Diet Recommendations NPO;Ice chips PRN after oral care Liquid Administration via Spoon Medication Administration Via alternative means Compensations Multiple dry swallows after each bite/sip Postural Changes --   No flowsheet data found.  CHL IP FOLLOW UP RECOMMENDATIONS 10/17/2018 Follow up Recommendations (No Data)   CHL IP FREQUENCY AND DURATION 10/17/2018 Speech Therapy Frequency (ACUTE ONLY) min 2x/week Treatment Duration 1 week      CHL IP ORAL PHASE 10/17/2018 Oral Phase Impaired Oral - Pudding Teaspoon -- Oral - Pudding Cup -- Oral - Honey Teaspoon -- Oral - Honey Cup -- Oral - Nectar Teaspoon Delayed oral transit;Weak lingual manipulation Oral - Nectar Cup Delayed oral transit;Weak lingual manipulation Oral - Nectar Straw Weak  lingual manipulation;Delayed oral transit Oral - Thin Teaspoon Weak lingual manipulation;Delayed oral transit Oral - Thin Cup Weak lingual manipulation;Delayed oral transit Oral - Thin Straw Weak lingual manipulation;Delayed oral transit Oral - Puree Weak lingual manipulation;Delayed oral transit;Lingual pumping Oral - Mech Soft NT Oral - Regular -- Oral - Multi-Consistency -- Oral - Pill NT Oral Phase - Comment --  CHL IP PHARYNGEAL PHASE 10/17/2018 Pharyngeal Phase Impaired Pharyngeal- Pudding Teaspoon -- Pharyngeal -- Pharyngeal- Pudding Cup -- Pharyngeal -- Pharyngeal- Honey Teaspoon -- Pharyngeal -- Pharyngeal- Honey Cup -- Pharyngeal -- Pharyngeal- Nectar Teaspoon Reduced laryngeal elevation;Reduced anterior laryngeal mobility;Reduced airway/laryngeal closure;Reduced epiglottic inversion;Pharyngeal residue - valleculae;Pharyngeal residue - pyriform;Reduced pharyngeal peristalsis Pharyngeal Material does not enter airway Pharyngeal- Nectar Cup Reduced epiglottic inversion;Reduced anterior laryngeal mobility;Reduced laryngeal elevation;Reduced airway/laryngeal closure;Reduced tongue base retraction;Pharyngeal residue - valleculae;Pharyngeal residue - pyriform;Reduced pharyngeal peristalsis Pharyngeal Material does not enter airway Pharyngeal- Nectar Straw Reduced laryngeal elevation;Reduced anterior laryngeal mobility;Reduced epiglottic inversion;Reduced pharyngeal peristalsis;Reduced airway/laryngeal closure;Pharyngeal residue - valleculae;Pharyngeal residue - pyriform;Reduced tongue base retraction Pharyngeal Material does not enter airway Pharyngeal- Thin Teaspoon Reduced epiglottic inversion;Reduced anterior laryngeal mobility;Reduced laryngeal elevation;Reduced airway/laryngeal closure;Reduced tongue base retraction;Penetration/Aspiration during swallow;Pharyngeal residue - valleculae;Pharyngeal residue - pyriform;Reduced pharyngeal peristalsis Pharyngeal Material enters airway, remains ABOVE vocal cords and  not ejected out Pharyngeal- Thin Cup Reduced pharyngeal peristalsis;Reduced epiglottic inversion;Reduced anterior laryngeal mobility;Reduced airway/laryngeal closure;Reduced tongue base retraction;Reduced laryngeal elevation;Pharyngeal residue - valleculae;Pharyngeal residue - pyriform;Penetration/Aspiration during swallow;Penetration/Apiration after swallow Pharyngeal Material enters airway, CONTACTS cords and not ejected out Pharyngeal- Thin Straw Reduced epiglottic inversion;Reduced anterior laryngeal mobility;Reduced laryngeal elevation;Reduced airway/laryngeal closure;Reduced tongue base retraction;Pharyngeal residue - valleculae;Pharyngeal residue - pyriform Pharyngeal Material enters airway, passes BELOW cords and not ejected out despite cough attempt by patient Pharyngeal- Puree Reduced epiglottic inversion;Reduced anterior laryngeal mobility;Reduced laryngeal elevation;Reduced airway/laryngeal closure;Reduced tongue base retraction;Pharyngeal residue - valleculae;Reduced pharyngeal peristalsis Pharyngeal Material does not enter airway Pharyngeal- Mechanical Soft NT Pharyngeal -- Pharyngeal- Regular -- Pharyngeal -- Pharyngeal- Multi-consistency -- Pharyngeal --  Pharyngeal- Pill NT Pharyngeal -- Pharyngeal Comment --  CHL IP CERVICAL ESOPHAGEAL PHASE 10/17/2018 Cervical Esophageal Phase Impaired Pudding Teaspoon -- Pudding Cup -- Honey Teaspoon -- Honey Cup -- Nectar Teaspoon Reduced cricopharyngeal relaxation Nectar Cup Reduced cricopharyngeal relaxation Nectar Straw Reduced cricopharyngeal relaxation Thin Teaspoon Reduced cricopharyngeal relaxation Thin Cup Reduced cricopharyngeal relaxation Thin Straw Reduced cricopharyngeal relaxation Puree Reduced cricopharyngeal relaxation Mechanical Soft -- Regular -- Multi-consistency -- Pill -- Cervical Esophageal Comment -- Macario Golds 10/17/2018, 9:24 AM Luanna Salk, MS [facility] SLP Acute Rehab Services Pager [phone] Office [phone]              Ct  Head Code Stroke Wo Contrast  Result Date: 10/14/2018 CLINICAL DATA:  Code stroke. Focal neuro deficit, less than 6 hours, stroke suspected. Additional history provided: Left-sided neglect, left facial droop. EXAM: CT HEAD WITHOUT CONTRAST TECHNIQUE: Contiguous axial images were obtained from the base of the skull through the vertex without intravenous contrast. COMPARISON:  No pertinent prior studies available for comparison. FINDINGS: Brain: No evidence of acute intracranial hemorrhage. No acute demarcated cortical infarction identified. Ill-defined hypoattenuation of the cerebral white matter is nonspecific, but consistent with chronic small vessel ischemic disease. Chronic appearing lacunar infarcts within the left corona radiata/internal capsule and within the left thalamus. Age-indeterminate lacunar infarct within the left caudate nucleus. No evidence of intracranial mass. No midline shift or extra-axial fluid collection. Moderate generalized parenchymal atrophy. Vascular: No hyperdense vessel. Skull: No calvarial fracture Sinuses/Orbits: Visualized orbits demonstrate no acute abnormality. Mucosal thickening within the partially imaged left maxillary sinus. Trace left mastoid effusion. ASPECTS [facility] Stroke Program Early CT Score) - Ganglionic level infarction (caudate, lentiform nuclei, internal capsule, insula, M1-M3 cortex): 7 - Supraganglionic infarction (M4-M6 cortex): 3 Total score (0-10 with 10 being normal): 10 (in the right MCA vascular territory). These results were called by telephone at the time of interpretation on 10/14/2018 at 5:57 pm to provider Dr. Erlinda Hong, who verbally acknowledged these results. IMPRESSION: No evidence of intracranial hemorrhage or acute demarcated cortical infarction. Age-indeterminate lacunar infarct within the left caudate. Chronic lacunar infarcts within the left corona radiata/internal capsule and left thalamus. Generalized parenchymal atrophy with chronic small vessel  ischemic disease. Electronically Signed   By: Kellie Simmering   On: 10/14/2018 18:07     Subjective: Patient seen, sleeping comfortably but awoke easily.  Appeared no acute distress.  Does not answer questions regarding symptoms other than denial of pain or discomfort at this time.  Discharge Exam: Vitals:   11/05/18 1730 11/05/18 2104  BP: (!) 157/96 137/90  Pulse: 91 92  Resp: 16 17  Temp:  98.5 F (36.9 C)  SpO2: 95% 100%   Vitals:   11/05/18 1700 11/05/18 1715 11/05/18 1730 11/05/18 2104  BP: (!) 148/99 (!) 155/111 (!) 157/96 137/90  Pulse: 92 90 91 92  Resp: (!) _0 Temp:    98.5 F (36.9 C)  TempSrc:    Oral  SpO2: 94% 94% 95% 100%  Weight:      Height:        General: Sleeping comfortably, not in acute distress Cardiovascular: RRR, S1/S2 +, no rubs, no gallops Respiratory: Decreased breath sounds, exam limited by patient not understanding to take deep breaths but would only open his mouth Abdominal: Soft, NT, ND, bowel sounds + Extremities: no edema, no cyanosis    The results of significant diagnostics from this hospitalization (including imaging, microbiology, ancillary and laboratory) are listed below for reference.  Microbiology: Recent Results (from the past 240 hour(s))  Blood Culture (routine x 2)     Status: None (Preliminary result)   Collection Time: 11/05/18  1:08 PM   Specimen: BLOOD LEFT ARM  Result Value Ref Range Status   Specimen Description BLOOD LEFT ARM  Final   Special Requests   Final    BOTTLES DRAWN AEROBIC AND ANAEROBIC Blood Culture adequate volume   Culture   Final    NO GROWTH < 24 HOURS Performed at [facility], [place] [address], [place], [place] [address]    Report Status PENDING  Incomplete  SARS Coronavirus 2 by RT PCR (hospital order, performed in [facility] hospital lab)     Status: None   Collection Time: 11/05/18  1:34 PM  Result Value Ref Range Status   SARS Coronavirus 2 NEGATIVE NEGATIVE Final     Comment: (NOTE) If result is NEGATIVE SARS-CoV-2 target nucleic acids are NOT DETECTED. The SARS-CoV-2 RNA is generally detectable in upper and lower  respiratory specimens during the acute phase of infection. The lowest  concentration of SARS-CoV-2 viral copies this assay can detect is 250  copies / mL. A negative result does not preclude SARS-CoV-2 infection  and should not be used as the sole basis for treatment or other  patient management decisions.  A negative result may occur with  improper specimen collection / handling, submission of specimen other  than nasopharyngeal swab, presence of viral mutation(s) within the  areas targeted by this assay, and inadequate number of viral copies  (<250 copies / mL). A negative result must be combined with clinical  observations, patient history, and epidemiological information. If result is POSITIVE SARS-CoV-2 target nucleic acids are DETECTED. The SARS-CoV-2 RNA is generally detectable in upper and lower  respiratory specimens dur ing the acute phase of infection.  Positive  results are indicative of active infection with SARS-CoV-2.  Clinical  correlation with patient history and other diagnostic information is  necessary to determine patient infection status.  Positive results do  not rule out bacterial infection or co-infection with other viruses. If result is PRESUMPTIVE POSTIVE SARS-CoV-2 nucleic acids MAY BE PRESENT.   A presumptive positive result was obtained on the submitted specimen  and confirmed on repeat testing.  While 2019 novel coronavirus  (SARS-CoV-2) nucleic acids may be present in the submitted sample  additional confirmatory testing may be necessary for epidemiological  and / or clinical management purposes  to differentiate between  SARS-CoV-2 and other Sarbecovirus currently known to infect humans.  If clinically indicated additional testing with an alternate test  methodology [phone]) is advised. The SARS-CoV-2  RNA is generally  detectable in upper and lower respiratory sp ecimens during the acute  phase of infection. The expected result is Negative. Fact Sheet for Patients:  StrictlyIdeas.no Fact Sheet for Healthcare Providers: BankingDealers.co.za This test is not yet approved or cleared by the [place] FDA and has been authorized for detection and/or diagnosis of SARS-CoV-2 by FDA under an Emergency Use Authorization (EUA).  This EUA will remain in effect (meaning this test can be used) for the duration of the COVID-19 declaration under Section 564(b)(1) of the Act, 21 U.S.C. section 360bbb-3(b)(1), unless the authorization is terminated or revoked sooner. Performed at [facility], [place] [address] [place], [place] [address]   Blood Culture (routine x 2)     Status: None (Preliminary result)   Collection Time: 11/05/18  9:29 PM   Specimen: BLOOD  LEFT HAND  Result Value Ref Range Status   Specimen Description BLOOD LEFT HAND  Final   Special Requests   Final    BOTTLES DRAWN AEROBIC ONLY Blood Culture adequate volume   Culture   Final    NO GROWTH < 12 HOURS Performed at [facility], [address] [place], [place] [address]    Report Status PENDING  Incomplete  MRSA PCR Screening     Status: None   Collection Time: 11/06/18  1:56 AM   Specimen: Nasopharyngeal  Result Value Ref Range Status   MRSA by PCR NEGATIVE NEGATIVE Final    Comment:        The GeneXpert MRSA Assay (FDA approved for NASAL specimens only), is one component of a comprehensive MRSA colonization surveillance program. It is not intended to diagnose MRSA infection nor to guide or monitor treatment for MRSA infections. Performed at [facility], [place] [address] [place], [place] [address]      Labs: BNP (last 3 results) No results for input(s): BNP in the last 8760 hours. Basic Metabolic Panel: Recent Labs  Lab 11/05/18 1316  NA  139  K 4.2  CL 99  CO2 27  GLUCOSE 187*  BUN 15  CREATININE 1.05  CALCIUM 9.5   Liver Function Tests: Recent Labs  Lab 11/05/18 1316  AST 28  ALT 26  ALKPHOS 62  BILITOT 0.3  PROT 6.8  ALBUMIN 3.0*   No results for input(s): LIPASE, AMYLASE in the last 168 hours. No results for input(s): AMMONIA in the last 168 hours. CBC: Recent Labs  Lab 11/05/18 1316  WBC 8.2  NEUTROABS 6.8  HGB 11.0*  HCT 33.6*  MCV 91.1  PLT 285   Cardiac Enzymes: No results for input(s): CKTOTAL, CKMB, CKMBINDEX, TROPONINI in the last 168 hours. BNP: Invalid input(s): POCBNP CBG: No results for input(s): GLUCAP in the last 168 hours. D-Dimer No results for input(s): DDIMER in the last 72 hours. Hgb A1c No results for input(s): HGBA1C in the last 72 hours. Lipid Profile No results for input(s): CHOL, HDL, LDLCALC, TRIG, CHOLHDL, LDLDIRECT in the last 72 hours. Thyroid function studies No results for input(s): TSH, T4TOTAL, T3FREE, THYROIDAB in the last 72 hours.  Invalid input(s): FREET3 Anemia work up No results for input(s): VITAMINB12, FOLATE, FERRITIN, TIBC, IRON, RETICCTPCT in the last 72 hours. Urinalysis    Component Value Date/Time   COLORURINE YELLOW 10/14/2018 2105   APPEARANCEUR CLOUDY (A) 10/14/2018 2105   LABSPEC 1.016 10/14/2018 2105   PHURINE 8.0 10/14/2018 2105   GLUCOSEU NEGATIVE 10/14/2018 2105   HGBUR NEGATIVE 10/14/2018 2105   BILIRUBINUR NEGATIVE 10/14/2018 2105   [place] NEGATIVE 10/14/2018 2105   PROTEINUR 30 (A) 10/14/2018 2105   NITRITE POSITIVE (A) 10/14/2018 2105   LEUKOCYTESUR LARGE (A) 10/14/2018 2105   Sepsis Labs Invalid input(s): PROCALCITONIN,  WBC,  LACTICIDVEN Microbiology Recent Results (from the past 240 hour(s))  Blood Culture (routine x 2)     Status: None (Preliminary result)   Collection Time: 11/05/18  1:08 PM   Specimen: BLOOD LEFT ARM  Result Value Ref Range Status   Specimen Description BLOOD LEFT ARM  Final   Special  Requests   Final    BOTTLES DRAWN AEROBIC AND ANAEROBIC Blood Culture adequate volume   Culture   Final    NO GROWTH < 24 HOURS Performed at [facility], [place] [address], [place], [place] [address]    Report Status PENDING  Incomplete  SARS Coronavirus 2 by RT PCR (hospital order, performed in [place] hospital lab)     Status: None   Collection Time: 11/05/18  1:34 PM  Result Value Ref Range Status   SARS Coronavirus 2 NEGATIVE NEGATIVE Final    Comment: (NOTE) If result is NEGATIVE SARS-CoV-2 target nucleic acids are NOT DETECTED. The SARS-CoV-2 RNA is generally detectable in upper and lower  respiratory specimens during the acute phase of infection. The lowest  concentration of SARS-CoV-2 viral copies this assay can detect is 250  copies / mL. A negative result does not preclude SARS-CoV-2 infection  and should not be used as the sole basis for treatment or other  patient management decisions.  A negative result may occur with  improper specimen collection / handling, submission of specimen other  than nasopharyngeal swab, presence of viral mutation(s) within the  areas targeted by this assay, and inadequate number of viral copies  (<250 copies / mL). A negative result must be combined with clinical  observations, patient history, and epidemiological information. If result is POSITIVE SARS-CoV-2 target nucleic acids are DETECTED. The SARS-CoV-2 RNA is generally detectable in upper and lower  respiratory specimens dur ing the acute phase of infection.  Positive  results are indicative of active infection with SARS-CoV-2.  Clinical  correlation with patient history and other diagnostic information is  necessary to determine patient infection status.  Positive results do  not rule out bacterial infection or co-infection with other viruses. If result is PRESUMPTIVE POSTIVE SARS-CoV-2 nucleic acids MAY BE PRESENT.   A presumptive positive result was obtained on the  submitted specimen  and confirmed on repeat testing.  While 2019 novel coronavirus  (SARS-CoV-2) nucleic acids may be present in the submitted sample  additional confirmatory testing may be necessary for epidemiological  and / or clinical management purposes  to differentiate between  SARS-CoV-2 and other Sarbecovirus currently known to infect humans.  If clinically indicated additional testing with an alternate test  methodology [phone]) is advised. The SARS-CoV-2 RNA is generally  detectable in upper and lower respiratory sp ecimens during the acute  phase of infection. The expected result is Negative. Fact Sheet for Patients:  StrictlyIdeas.no Fact Sheet for Healthcare Providers: BankingDealers.co.za This test is not yet approved or cleared by the [place] FDA and has been authorized for detection and/or diagnosis of SARS-CoV-2 by FDA under an Emergency Use Authorization (EUA).  This EUA will remain in effect (meaning this test can be used) for the duration of the COVID-19 declaration under Section 564(b)(1) of the Act, 21 U.S.C. section 360bbb-3(b)(1), unless the authorization is terminated or revoked sooner. Performed at [facility], [place] [address], [place], [place] [address]   Blood Culture (routine x 2)     Status: None (Preliminary result)   Collection Time: 11/05/18  9:29 PM   Specimen: BLOOD LEFT HAND  Result Value Ref Range Status   Specimen Description BLOOD LEFT HAND  Final   Special Requests   Final    BOTTLES DRAWN AEROBIC ONLY Blood Culture adequate volume   Culture   Final    NO GROWTH < 12 HOURS Performed at [facility], [place] [address] [place], [place] [address]    Report Status PENDING  Incomplete  MRSA PCR Screening     Status: None   Collection Time: 11/06/18  1:56 AM   Specimen: Nasopharyngeal  Result Value Ref Range Status   MRSA by PCR NEGATIVE NEGATIVE  Final    Comment:        The  GeneXpert MRSA Assay (FDA approved for NASAL specimens only), is one component of a comprehensive MRSA colonization surveillance program. It is not intended to diagnose MRSA infection nor to guide or monitor treatment for MRSA infections. Performed at [facility], [place] [address] [place], [place] [address]      Time coordinating discharge: Over 30 minutes  SIGNED:   Ezekiel Slocumb, DO [facility] 11/06/2018, 12:02 PM Pager [phone]  If 7PM-7AM, please contact night-coverage [email or web address] Password [email or web address]

## 2018-11-06 NOTE — TOC Progression Note (Addendum)
Transition of Care (TOC) - Progression Note    Patient Details  Name: Ridgely Anastacio. MRN: [id number] Date of Birth: 06/06/1934  Transition of Care [facility]) CM/SW [place], [place] Phone Number: 11/06/2018, 3:25 PM  Clinical Narrative:    Was updated that available beds had been filled today at [facility] today. CSW has alerted MD, will f/u with [facility] int he morning for availability.    Expected Discharge Plan: [place] Barriers to Discharge: No Barriers Identified  Expected Discharge Plan and Services Expected Discharge Plan: [place] In-house Referral: Clinical Social Work, Hospice / Palliative Care Discharge Planning Services: CM Consult Post Acute Care Choice: Hospice Living arrangements for the past 2 months: [place] Expected Discharge Date: 11/06/18                 Social Determinants of Health (SDOH) Interventions    Readmission Risk Interventions Readmission Risk Prevention Plan 11/06/2018  Transportation Screening Not Complete  Transportation Screening Comment SNF pt- plan for residential hospice  PCP or Specialist Appt within 5-7 Days Not Complete  Not Complete comments SNF pt- plan for residential hospice  Home Care Screening Not Complete  Home Care Screening Not Completed Comments SNF pt- plan for residential hospice  Medication Review (RN CM) Not Complete  Med Review comments SNF pt- plan for residential hospice

## 2018-11-06 NOTE — Progress Notes (Signed)
[facility]:  [place] to the pt's son over the phone to let him know that we have received the referral for our in pt. facility in [place]. We however are unable to offer a bed today due to capacity. We will follow up and contact SW and family if one become available later today.   Webb Silversmith RN [phone]

## 2018-11-06 NOTE — Consult Note (Addendum)
Consultation Note Date: 11/06/2018   Patient Name: Mason Webb.  DOB: September 11, 1934  MRN: [id number]  Age / Sex: 84 y.o., male  PCP: Mason Freestone, MD Referring Physician: Ezekiel Slocumb, DO  Reason for Consultation: Hospice Evaluation, Inpatient hospice referral, Psychosocial/spiritual support and Terminal Care  HPI/Patient Profile: 84 y.o. male  with past medical history of dementia, CHF, a fib, DM, HTN, HLD, CAD, hemorrhagic CVA, and dysphagia admitted on 11/05/2018 with AMS.  Patient recently admitted (10/3-10/13) d/t UTI. At that time palliative care was involved and family opted to focus on comfort however patient improved enough to return to SNF. Patient presents today with ongoing decline, hypothermia, and elevated lactic acid. Family has opted once again to focus on comfort. Patient does have a history of PEG tube - this has been removed and patient does not want replacement - he eats by mouth and accepts severe risk of aspiration. PMT consulted to assist with comfort care/hospice placement.   Clinical Assessment and Goals of Care: I have reviewed medical records including EPIC notes, labs and imaging, and received report from RN - RN tells me patient is sleeping, does not want to be touched, not eating.   I then spoke with patient's daughter, Mason Webb,  to discuss diagnosis prognosis, [name], EOL wishes, disposition and options.  Mason Webb tells me she is HCPOA - she does have 2 other siblings - they are involved in decision making process but defer to her. Mason Webb is currently living in [place] - her siblings are local in [place].   I introduced Palliative Medicine as specialized medical care for people living with serious illness. It focuses on providing relief from the symptoms and stress of a serious illness. The goal is to improve quality of life for both the patient and the family.  As far as functional and  nutritional status, she speaks of a decline - essentially bedbound now. Not eating much d/t lack of interest coupled with severe dysphagia.    We discussed his current illness and what it means in the larger context of his on-going co-morbidities.  Natural disease trajectory and expectations at EOL were discussed. Mason Webb is not interested in any further workup and wants to focus on her father's comfort.   The difference between aggressive medical intervention and comfort care was considered in light of the patient's goals of care.   Hospice services outpatient were explained and offered. Mason Webb is interested in a hospice facility for her father to focus on his comfort and relief of suffering as he nears the end of his life. Interested in placement in [place] or [place].   Questions and concerns were addressed. The family was encouraged to call with questions or concerns.    Primary Decision Maker HCPOA - daughter Mason Webb reports she is HCPOA - other siblings defer to her but they all seem to agree on plan of care per her report    SUMMARY OF RECOMMENDATIONS   - continue full comfort measures - PRN medication as necessary - CSW referral for hospice facility placement in [place] or [place]  Code Status/Advance Care Planning:  DNR   Symptom Management:   Continue PRN morphine, haldol, ativan, robinul   Palliative Prophylaxis:   Frequent Pain Assessment, Oral Care and Turn Reposition  Additional Recommendations (Limitations, Scope, Preferences):  Full Comfort Care  Psycho-social/Spiritual:   Desire for further Chaplaincy support:no  Additional Recommendations: Education on Hospice  Prognosis:   < 2 weeks  Discharge Planning:  Hospice facility      Primary Diagnoses: Present on Admission: . Hypothermia . Adult failure to thrive . Hypertension . Hyperlipidemia   I have reviewed the medical record, interviewed the patient and family, and examined the patient. The  following aspects are pertinent.  Past Medical History:  Diagnosis Date  . Atrial fibrillation (HCC)    not on AC  . CAD (coronary artery disease)   . Dementia (HCC)   . Diastolic CHF (HCC)   . DM (diabetes mellitus) (HCC)   . Dysphagia as late effect of stroke   . Hemorrhagic stroke (HCC)   . Hyperlipidemia   . Hypertension    Social History   Socioeconomic History  . Marital status: Married    Spouse name: Not on file  . Number of children: Not on file  . Years of education: Not on file  . Highest education level: Not on file  Occupational History  . Not on file  Social Needs  . Financial resource strain: Not on file  . Food insecurity    Worry: Not on file    Inability: Not on file  . Transportation needs    Medical: Not on file    Non-medical: Not on file  Tobacco Use  . Smoking status: Former Games developer  . Smokeless tobacco: Never Used  Substance and Sexual Activity  . Alcohol use: Not Currently  . Drug use: Not on file  . Sexual activity: Not on file  Lifestyle  . Physical activity    Days per week: Not on file    Minutes per session: Not on file  . Stress: Not on file  Relationships  . Social Musician on phone: Not on file    Gets together: Not on file    Attends religious service: Not on file    Active member of club or organization: Not on file    Attends meetings of clubs or organizations: Not on file    Relationship status: Not on file  Other Topics Concern  . Not on file  Social History Narrative  . Not on file   No family history on file. Scheduled Meds: Continuous Infusions: PRN Meds:.acetaminophen **OR** acetaminophen, antiseptic oral rinse, glycopyrrolate **OR** glycopyrrolate **OR** glycopyrrolate, haloperidol **OR** haloperidol **OR** haloperidol lactate, LORazepam **OR** LORazepam **OR** LORazepam, morphine injection, morphine CONCENTRATE, ondansetron **OR** ondansetron (ZOFRAN) IV, polyvinyl alcohol No Known Allergies  Vital  Signs: BP 137/90   Pulse 92   Temp 98.5 F (36.9 C) (Oral)   Resp 17   Ht 6' (1.829 m)   Wt 79 kg   SpO2 100%   BMI 23.62 kg/m  Pain Scale: 0-10   Pain Score: 0-No pain   SpO2: SpO2: 100 % O2 Device:SpO2: 100 % O2 Flow Rate: .   IO: Intake/output summary:   Intake/Output Summary (Last 24 hours) at 11/06/2018 3382 Last data filed at 11/05/2018 1511 Gross per 24 hour  Intake 100 ml  Output -  Net 100 ml    LBM: Last BM Date: (PTA) Baseline Weight: Weight: 79 kg Most recent weight: Weight: 79 kg     Palliative Assessment/Data: PPS 10%    The above conversation was completed via telephone due to the visitor restrictions during the COVID-19 pandemic. Thorough chart review and discussion with necessary members of the care team was completed as part of assessment. All issues were discussed and addressed but no physical exam was performed.  Time Total: 50 minutes Greater than 50%  of  this time was spent counseling and coordinating care related to the above assessment and plan.  Juel Burrow, DNP, AGNP-C Palliative Medicine Team [phone] Pager: [phone]

## 2018-11-06 NOTE — TOC Initial Note (Signed)
Transition of Care (TOC) - Initial/Assessment Note    Patient Details  Name: Mason Webb. MRN: [id number] Date of Birth: 1934-12-12  Transition of Care [facility]) CM/SW Contact:    [place], [place] Phone Number: 11/06/2018, 11:04 AM  Clinical Narrative:                 CSW spoke with pt daughter [name] who is HCPOA. Introduced self, role, reason for call. Pt daughter aware of reason for call, we discussed referrals made for [facility] ([place]) and [facility] ([place]), she knows that there are no beds at [facility] but that [facility] does have availability. [name] states that her brother [name] lives in [place] and would be able to complete paperwork as needed.   Called and provided Cheri, liaison with [place] the referral and [facility] # [phone]). CSW aware that there is likely a bed today but await confirmation. DNR signed by PMT provider. Will place PTAR papers on chart. Await approval and confirmation of bed placement.   Expected Discharge Plan: [place] Barriers to Discharge: No Barriers Identified   Patient Goals and CMS Choice Patient states their goals for this hospitalization and ongoing recovery are:: for him to be comfortable and near family CMS Medicare.gov Compare Post Acute Care list provided to:: Other (Comment Required)(n/a) Choice offered to / list presented to : [facility] POA / Guardian, Adult Children  Expected Discharge Plan and Services Expected Discharge Plan: [place] In-house Referral: Clinical Social Work, Hospice / Palliative Care Discharge Planning Services: CM Consult Post Acute Care Choice: Hospice Living arrangements for the past 2 months: [place]    Prior Living Arrangements/Services Living arrangements for the past 2 months: [place] Lives with:: Facility Resident Patient language and need for interpreter reviewed:: Yes Do you feel safe going back to  the place where you live?: No   plan for hospice, focus on comfort  Need for Family Participation in Patient Care: Yes (Comment)(assistance with decision making) Care giver support system in place?: Yes (comment)(adult children) Current home services: DME Criminal Activity/Legal Involvement Pertinent to Current Situation/Hospitalization: No - Comment as needed  Activities of Daily Living      Permission Sought/Granted Permission sought to share information with : Facility Sport and exercise psychologist, Family Supports Permission granted to share information with : No(pt disoriented)  Share Information with NAME: [place] granted to share info w AGENCY: [facility] granted to share info w Relationship: daughter  Permission granted to share info w Contact Information: [phone]  Emotional Assessment Appearance:: Other (Comment Required(telephonic assessment completed with daughter) Attitude/Demeanor/Rapport: (telephonic assessment completed with daughter) Affect (typically observed): (telephonic assessment completed with daughter) Orientation: : Oriented to Self Alcohol / Substance Use: Not Applicable Psych Involvement: No (comment)  Admission diagnosis:  Altered mental status, unspecified altered mental status type [R41.82] Sepsis, due to unspecified organism, unspecified whether acute organ dysfunction present [facility]) [A41.9] Patient Active Problem List   Diagnosis Date Noted  . Goals of care, counseling/discussion   . Comfort measures only status   . Hypothermia 11/05/2018  . Proteus mirabilis infection 10/28/2018  . Dehydration 10/28/2018  . Gout 10/28/2018  . GERD (gastroesophageal reflux disease) 10/28/2018  . Adult failure to thrive   . DNR (do not resuscitate)   . Palliative care by specialist   . Dysphagia   . Encephalopathy acute 10/15/2018  . Acute lower UTI 10/14/2018  .  Altered mental status 10/14/2018  .  Hypertension 10/14/2018  . Hyperlipidemia 10/14/2018   PCP:  Zoila Shutter, MD Pharmacy:   [id number] [place], [place] - [address] [place] [address] Phone: [phone] Fax: [phone]     Social Determinants of Health (SDOH) Interventions    Readmission Risk Interventions Readmission Risk Prevention Plan 11/06/2018  Transportation Screening Not Complete  Transportation Screening Comment SNF pt- plan for residential hospice  PCP or Specialist Appt within 5-7 Days Not Complete  Not Complete comments SNF pt- plan for residential hospice  Home Care Screening Not Complete  Home Care Screening Not Completed Comments SNF pt- plan for residential hospice  Medication Review (RN CM) Not Complete  Med Review comments SNF pt- plan for residential hospice

## 2018-11-07 LAB — URINE CULTURE: Culture: <10000 — AB

## 2018-11-07 NOTE — TOC Progression Note (Addendum)
Transition of Care (TOC) - Progression Note    Patient Details  Name: Mason Webb. MRN: [id number] Date of Birth: 04-Dec-1934  Transition of Care [facility]) CM/SW [place], [place] Phone Number: 11/07/2018, 10:11 AM  Clinical Narrative:    3:42pm- Pt eligible for [facility], no bed availability today.  10:11am- Acknowledging note from [place] w/ [facility]. PMT has stated that they believe pt is hospice eligible. Family had preferred [facility] or [facility]. CSW has provided referral to [facility] for review.    Expected Discharge Plan: [place] Barriers to Discharge: No Barriers Identified  Expected Discharge Plan and Services Expected Discharge Plan: [place] In-house Referral: Clinical Social Work, Hospice / Palliative Care Discharge Planning Services: CM Consult Post Acute Care Choice: Hospice Living arrangements for the past 2 months: [place] Expected Discharge Date: 11/06/18                   Social Determinants of Health (SDOH) Interventions    Readmission Risk Interventions Readmission Risk Prevention Plan 11/06/2018  Transportation Screening Not Complete  Transportation Screening Comment SNF pt- plan for residential hospice  PCP or Specialist Appt within 5-7 Days Not Complete  Not Complete comments SNF pt- plan for residential hospice  Home Care Screening Not Complete  Home Care Screening Not Completed Comments SNF pt- plan for residential hospice  Medication Review (RN CM) Not Complete  Med Review comments SNF pt- plan for residential hospice

## 2018-11-07 NOTE — Progress Notes (Signed)
[place]:  [facility]  Evaluated pt for our GIP level of care and after review and discussion with our MD he was not approved for GIP level of care at [facility] home at [facility]. He could benefit from going home with hospice services if family is interested in this. I have placed a call to the son to discuss this further. He will reach out to his sister and discuss and also will see if [facility] has a bed to offer.  We have no residential beds available at this time to offer. Webb Silversmith RN [phone]

## 2018-11-07 NOTE — Progress Notes (Signed)
[facility] - Optometrist  Received request from [place] for family interest in [place]. Chart reviewed and eligibility confirmed. Unfortunately [facility] is not able to offer a room today. Spoke briefly by phone with daughter [name]. She was driving so call cut short. She is aware we will follow up regarding availability.   Thank you,  Erling Conte, [facility] Liaison [phone]

## 2018-11-07 NOTE — Progress Notes (Signed)
PROGRESS NOTE    Mason Webb.  XFG:[id number] DOB: 07/20/1934 DOA: 11/05/2018  PCP: Burman Freestone, MD    LOS - 2   Brief Narrative:  84 y.o.malewith medical history significant ofadvanced dementia; diastolic CHF; afib not on AC; DM; HTN; HLD: CAD; and hemorrhagic CVA presenting with lethargy.  He was admitted from 10/3-13 for acute metabolic encephalopathy associated with Proteus UTI and dehydration. While previously home, he was discharged to SNF. He had h/o chronic dysphagia with PEG tube that had been removed; palliative care met with family and they elected comfort feeds and no further G-tube placement. He was made DNR with no aggressive me He was previously living home with care from aides and family. He has had intermittent episodes of unresponsiveness and had one today. "Totally out of it, blood pressure drops." He has been hospitalized with these episodes before, and full evaluation has been unremarkable. He has intermasures, supportive care only. ittent confusion.For the past few months, he has deteriorated and needs help with ambulation,can get up with assistance to the bathroom. Goals of care discussed with patient's daughter upon admission and with palliative team. If was decided to continue full comfort measures with PRN medications for comfort purposes only, and referral to hospice facility. Family requests patient be able to eat if he is interested, understanding risk of aspiration, would prefer he be able to eat for pleasure.  Subjective 10/27: Patient seen, awake lying in bed.  More alert and little less confused today.  Denies pain or discomfort, fevers or chills.    Assessment & Plan:   Principal Problem:   Hypothermia Active Problems:   Hypertension   Hyperlipidemia   Dysphagia   Adult failure to thrive   Goals of care, counseling/discussion   Comfort measures only status  Hypothermia Comfort Care Status  -Medications and antibiotics  discontinued -Palliative on board -Comfort feeds, family aware of aspiration risk  -Per family, no antibiotics or IV fluids -Pain control with as needed morphine -As needed Ativan for anxiety and Haldol for delirium or agitation -As needed Tylenol for mild pain or fevers   COVID-19 negative   DVT prophylaxis: None, Comfort Care   Code Status: DNR  Family Communication: None present at bedside Disposition Plan: To [facility] hospice pending available bed   Consultants:   Palliative  Procedures:   None  Antimicrobials:   None   Objective: Vitals:   11/05/18 1715 11/05/18 1730 11/05/18 2104 11/07/18 0533  BP: (!) 155/111 (!) 157/96 137/90 (!) 188/97  Pulse: 90 91 92 80  Resp: _0 Temp:   98.5 F (36.9 C) 98.5 F (36.9 C)  TempSrc:   Oral Oral  SpO2: 94% 95% 100% 100%  Weight:      Height:        Intake/Output Summary (Last 24 hours) at 11/07/2018 2306 Last data filed at 11/07/2018 1500 Gross per 24 hour  Intake 240 ml  Output 850 ml  Net -610 ml   Filed Weights   11/05/18 1308  Weight: 79 kg    Examination:  General exam: awake, alert, no acute distress Respiratory system: clear to auscultation anteriorly, no wheezes, rales or rhonchi, normal respiratory effort. Cardiovascular system: normal S1/S2,  RRR, no pedal edema.   Gastrointestinal system: soft, non-tender, non-distended abdomen, no organomegaly or masses felt, normal bowel sounds. Skin: dry, intact, normal temperature   Data Reviewed: I have personally reviewed following labs and imaging studies  CBC: Recent Labs  Lab 11/05/18  1316  WBC 8.2  NEUTROABS 6.8  HGB 11.0*  HCT 33.6*  MCV 91.1  PLT 678   Basic Metabolic Panel: Recent Labs  Lab 11/05/18 1316  NA 139  K 4.2  CL 99  CO2 27  GLUCOSE 187*  BUN 15  CREATININE 1.05  CALCIUM 9.5   GFR: Estimated Creatinine Clearance: 59.5 mL/min (by C-G formula based on SCr of 1.05 mg/dL). Liver Function Tests:  Recent Labs  Lab 11/05/18 1316  AST 28  ALT 26  ALKPHOS 62  BILITOT 0.3  PROT 6.8  ALBUMIN 3.0*   No results for input(s): LIPASE, AMYLASE in the last 168 hours. No results for input(s): AMMONIA in the last 168 hours. Coagulation Profile: Recent Labs  Lab 11/05/18 1316  INR 0.9   Cardiac Enzymes: No results for input(s): CKTOTAL, CKMB, CKMBINDEX, TROPONINI in the last 168 hours. BNP (last 3 results) No results for input(s): PROBNP in the last 8760 hours. HbA1C: No results for input(s): HGBA1C in the last 72 hours. CBG: No results for input(s): GLUCAP in the last 168 hours. Lipid Profile: No results for input(s): CHOL, HDL, LDLCALC, TRIG, CHOLHDL, LDLDIRECT in the last 72 hours. Thyroid Function Tests: No results for input(s): TSH, T4TOTAL, FREET4, T3FREE, THYROIDAB in the last 72 hours. Anemia Panel: No results for input(s): VITAMINB12, FOLATE, FERRITIN, TIBC, IRON, RETICCTPCT in the last 72 hours. Sepsis Labs: Recent Labs  Lab 11/05/18 1317  LATICACIDVEN 3.8*    Recent Results (from the past 240 hour(s))  Blood Culture (routine x 2)     Status: Abnormal   Collection Time: 11/05/18  1:08 PM   Specimen: BLOOD LEFT ARM  Result Value Ref Range Status   Specimen Description BLOOD LEFT ARM  Final   Special Requests   Final    BOTTLES DRAWN AEROBIC AND ANAEROBIC Blood Culture adequate volume   Culture  Setup Time   Final    GRAM POSITIVE COCCI IN CLUSTERS IN BOTH AEROBIC AND ANAEROBIC BOTTLES CRITICAL RESULT CALLED TO, READ BACK BY AND VERIFIED WITH: PHARMD T BAUMEISTER [id number] AT 27 BY CM    Culture (A)  Final    STAPHYLOCOCCUS SPECIES (COAGULASE NEGATIVE) THE SIGNIFICANCE OF ISOLATING THIS ORGANISM FROM A SINGLE SET OF BLOOD CULTURES WHEN MULTIPLE SETS ARE DRAWN IS UNCERTAIN. PLEASE NOTIFY THE MICROBIOLOGY DEPARTMENT WITHIN ONE WEEK IF SPECIATION AND SENSITIVITIES ARE REQUIRED. Performed at [facility], [place] [address], [place], [place] [address]    Report  Status 11/07/2018 FINAL  Final  SARS Coronavirus 2 by RT PCR (hospital order, performed in [place] hospital lab)     Status: None   Collection Time: 11/05/18  1:34 PM  Result Value Ref Range Status   SARS Coronavirus 2 NEGATIVE NEGATIVE Final    Comment: (NOTE) If result is NEGATIVE SARS-CoV-2 target nucleic acids are NOT DETECTED. The SARS-CoV-2 RNA is generally detectable in upper and lower  respiratory specimens during the acute phase of infection. The lowest  concentration of SARS-CoV-2 viral copies this assay can detect is 250  copies / mL. A negative result does not preclude SARS-CoV-2 infection  and should not be used as the sole basis for treatment or other  patient management decisions.  A negative result may occur with  improper specimen collection / handling, submission of specimen other  than nasopharyngeal swab, presence of viral mutation(s) within the  areas targeted by this assay, and inadequate number of viral copies  (<250 copies / mL). A negative result must  be combined with clinical  observations, patient history, and epidemiological information. If result is POSITIVE SARS-CoV-2 target nucleic acids are DETECTED. The SARS-CoV-2 RNA is generally detectable in upper and lower  respiratory specimens dur ing the acute phase of infection.  Positive  results are indicative of active infection with SARS-CoV-2.  Clinical  correlation with patient history and other diagnostic information is  necessary to determine patient infection status.  Positive results do  not rule out bacterial infection or co-infection with other viruses. If result is PRESUMPTIVE POSTIVE SARS-CoV-2 nucleic acids MAY BE PRESENT.   A presumptive positive result was obtained on the submitted specimen  and confirmed on repeat testing.  While 2019 novel coronavirus  (SARS-CoV-2) nucleic acids may be present in the submitted sample  additional confirmatory testing may be necessary for epidemiological   and / or clinical management purposes  to differentiate between  SARS-CoV-2 and other Sarbecovirus currently known to infect humans.  If clinically indicated additional testing with an alternate test  methodology [phone]) is advised. The SARS-CoV-2 RNA is generally  detectable in upper and lower respiratory sp ecimens during the acute  phase of infection. The expected result is Negative. Fact Sheet for Patients:  StrictlyIdeas.no Fact Sheet for Healthcare Providers: BankingDealers.co.za This test is not yet approved or cleared by the [place] FDA and has been authorized for detection and/or diagnosis of SARS-CoV-2 by FDA under an Emergency Use Authorization (EUA).  This EUA will remain in effect (meaning this test can be used) for the duration of the COVID-19 declaration under Section 564(b)(1) of the Act, 21 U.S.C. section 360bbb-3(b)(1), unless the authorization is terminated or revoked sooner. Performed at [facility], [place] [address] [place], [place] [address]   Blood Culture (routine x 2)     Status: Abnormal   Collection Time: 11/05/18  9:29 PM   Specimen: BLOOD LEFT HAND  Result Value Ref Range Status   Specimen Description BLOOD LEFT HAND  Final   Special Requests   Final    BOTTLES DRAWN AEROBIC ONLY Blood Culture adequate volume   Culture  Setup Time   Final    AEROBIC BOTTLE ONLY GRAM POSITIVE COCCI CRITICAL RESULT CALLED TO, READ BACK BY AND VERIFIED WITH: G ABBOTT PHARMD 11/06/18 2307 JDW    Culture (A)  Final    STAPHYLOCOCCUS SPECIES (COAGULASE NEGATIVE) THE SIGNIFICANCE OF ISOLATING THIS ORGANISM FROM A SINGLE SET OF BLOOD CULTURES WHEN MULTIPLE SETS ARE DRAWN IS UNCERTAIN. PLEASE NOTIFY THE MICROBIOLOGY DEPARTMENT WITHIN ONE WEEK IF SPECIATION AND SENSITIVITIES ARE REQUIRED. Performed at [facility], [place] [address], [place], [place] [address]    Report Status 11/07/2018 FINAL  Final  Blood  Culture ID Panel (Reflexed)     Status: Abnormal   Collection Time: 11/05/18  9:29 PM  Result Value Ref Range Status   Enterococcus species NOT DETECTED NOT DETECTED Final   Listeria monocytogenes NOT DETECTED NOT DETECTED Final   Staphylococcus species DETECTED (A) NOT DETECTED Final    Comment: Methicillin (oxacillin) resistant coagulase negative staphylococcus. Possible blood culture contaminant (unless isolated from more than one blood culture draw or clinical case suggests pathogenicity). No antibiotic treatment is indicated for blood  culture contaminants. CRITICAL RESULT CALLED TO, READ BACK BY AND VERIFIED WITH: G ABBOTT PHARMD 11/06/18 2307 JDW    Staphylococcus aureus (BCID) NOT DETECTED NOT DETECTED Final   Methicillin resistance DETECTED (A) NOT DETECTED Final    Comment: CRITICAL RESULT CALLED TO, READ BACK BY  AND VERIFIED WITH: G ABBOTT PHARMD 11/06/18 2307 JDW    Streptococcus species NOT DETECTED NOT DETECTED Final   Streptococcus agalactiae NOT DETECTED NOT DETECTED Final   Streptococcus pneumoniae NOT DETECTED NOT DETECTED Final   Streptococcus pyogenes NOT DETECTED NOT DETECTED Final   Acinetobacter baumannii NOT DETECTED NOT DETECTED Final   Enterobacteriaceae species NOT DETECTED NOT DETECTED Final   Enterobacter cloacae complex NOT DETECTED NOT DETECTED Final   Escherichia coli NOT DETECTED NOT DETECTED Final   Klebsiella oxytoca NOT DETECTED NOT DETECTED Final   Klebsiella pneumoniae NOT DETECTED NOT DETECTED Final   Proteus species NOT DETECTED NOT DETECTED Final   Serratia marcescens NOT DETECTED NOT DETECTED Final   Haemophilus influenzae NOT DETECTED NOT DETECTED Final   Neisseria meningitidis NOT DETECTED NOT DETECTED Final   Pseudomonas aeruginosa NOT DETECTED NOT DETECTED Final   Candida albicans NOT DETECTED NOT DETECTED Final   Candida glabrata NOT DETECTED NOT DETECTED Final   Candida krusei NOT DETECTED NOT DETECTED Final   Candida parapsilosis NOT  DETECTED NOT DETECTED Final   Candida tropicalis NOT DETECTED NOT DETECTED Final    Comment: Performed at [facility], [place] [address] [place], [place] [address]  Urine culture     Status: Abnormal   Collection Time: 11/06/18  1:55 AM   Specimen: In/Out Cath Urine  Result Value Ref Range Status   Specimen Description IN/OUT CATH URINE  Final   Special Requests   Final    NONE Performed at [facility], [place] [address], [place], [place] [address]    Culture (A)  Final    <10,000 COLONIES/mL INSIGNIFICANT GROWTH MULTIPLE SPECIES PRESENT, SUGGEST RECOLLECTION   Report Status 11/07/2018 FINAL  Final  MRSA PCR Screening     Status: None   Collection Time: 11/06/18  1:56 AM   Specimen: Nasopharyngeal  Result Value Ref Range Status   MRSA by PCR NEGATIVE NEGATIVE Final    Comment:        The GeneXpert MRSA Assay (FDA approved for NASAL specimens only), is one component of a comprehensive MRSA colonization surveillance program. It is not intended to diagnose MRSA infection nor to guide or monitor treatment for MRSA infections. Performed at [facility], [place] [address], [place], [place] [address]          Radiology Studies: No results found.      Scheduled Meds: Continuous Infusions:   LOS: 2 days    Time spent: 20 min    Ezekiel Slocumb, DO [facility] Pager: [phone]  If 7PM-7AM, please contact night-coverage [email or web address] Password [email or web address] 11/07/2018, 11:06 PM

## 2018-11-08 DIAGNOSIS — R4182 Altered mental status, unspecified: Secondary | ICD-10-CM

## 2018-11-08 DIAGNOSIS — Z515 Encounter for palliative care: Secondary | ICD-10-CM

## 2018-11-08 DIAGNOSIS — A419 Sepsis, unspecified organism: Secondary | ICD-10-CM

## 2018-11-08 DIAGNOSIS — R627 Adult failure to thrive: Secondary | ICD-10-CM

## 2018-11-08 NOTE — Progress Notes (Signed)
Attempt made to call report at [facility], spoke with Doroteo Bradford who stated the nurse taking patient was receiving report for another patient and to call back at a later time. Will call back at 1730.

## 2018-11-08 NOTE — Social Work (Signed)
Clinical Social Worker facilitated patient discharge including contacting patient family and facility to confirm patient discharge plans.  Clinical information faxed to facility and family agreeable with plan.  CSW arranged ambulance transport via [facility] to [facility] RN to call [phone]  with report prior to discharge.  Clinical Social Worker will sign off for now as social work intervention is no longer needed. Please consult us again if new need arises.  Keonna Raether, MSW, LCSWA Clinical Social Worker [phone]  

## 2018-11-08 NOTE — Progress Notes (Signed)
Manufacturing engineer [facility])  Consents completed for pt to transfer to [facility].  Please call report to [phone]  Please d/c and IVs, if indwelling foley in place, please leave in  Thank you, Venia Carbon RN, BSN, [place] (in [place]) [phone]

## 2018-11-08 NOTE — Progress Notes (Signed)
Report given to Carol, RN at [facility].  

## 2018-11-08 NOTE — TOC Transition Note (Signed)
Transition of Care [facility]) - CM/SW Discharge Note   Patient Details  Name: Mason Webb. MRN: [id number] Date of Birth: 1934-04-06  [facility]) CM/SW Contact:  Alexander Mt, [place] Phone Number: 11/08/2018, 12:54 PM   Clinical Narrative:    Plan for paperwork to be completed at 5pm tonight. All information for discharge sent to [facility].  RN to call report. PTAR scheduled for 6pm.    Final next level of care: [place] Barriers to Discharge: No Barriers Identified   Patient Goals and CMS Choice Patient states their goals for this hospitalization and ongoing recovery are:: for him to be comfortable and near family CMS Medicare.gov Compare Post Acute Care list provided to:: Other (Comment Required)(n/a) Choice offered to / list presented to : [facility] POA / [place], Adult Children  Discharge Placement        Patient chooses bed at: Other - please specify in the comment section below:([facility]) Patient to be transferred to facility by: [place] Name of family member notified: pt son Patient and family notified of of transfer: 11/08/18  Discharge Plan and Services In-house Referral: Clinical Social Work, Hospice / Palliative Care Discharge Planning Services: CM Consult Post Acute Care Choice: Hospice                               Social Determinants of Health (SDOH) Interventions     Readmission Risk Interventions Readmission Risk Prevention Plan 11/06/2018  Transportation Screening Not Complete  Transportation Screening Comment SNF pt- plan for residential hospice  PCP or Specialist Appt within 5-7 Days Not Complete  Not Complete comments SNF pt- plan for residential hospice  Home Care Screening Not Complete  Home Care Screening Not Completed Comments SNF pt- plan for residential hospice  Medication Review (RN CM) Not Complete  Med Review comments SNF pt- plan for residential hospice

## 2018-11-08 NOTE — TOC Progression Note (Signed)
Transition of Care (TOC) - Progression Note    Patient Details  Name: Mason Webb. MRN: [id number] Date of Birth: August 17, 1934  Transition of Care [facility]) CM/SW [place], [place] Phone Number: 11/08/2018, 9:55 AM  Clinical Narrative:    Pt approved for transfer to [facility].  Bed available. Pt MD secure chatted with this update.  Await confirmation that paperwork has been completed prior to dc. DNR signed on chart and PTAR papers updated.   Expected Discharge Plan: [place] Barriers to Discharge: Barriers Resolved  Expected Discharge Plan and Services Expected Discharge Plan: [place] In-house Referral: Clinical Social Work, Hospice / Palliative Care Discharge Planning Services: CM Consult Post Acute Care Choice: Hospice Living arrangements for the past 2 months: [place] Expected Discharge Date: 11/06/18                   Social Determinants of Health (SDOH) Interventions    Readmission Risk Interventions Readmission Risk Prevention Plan 11/06/2018  Transportation Screening Not Complete  Transportation Screening Comment SNF pt- plan for residential hospice  PCP or Specialist Appt within 5-7 Days Not Complete  Not Complete comments SNF pt- plan for residential hospice  Home Care Screening Not Complete  Home Care Screening Not Completed Comments SNF pt- plan for residential hospice  Medication Review (RN CM) Not Complete  Med Review comments SNF pt- plan for residential hospice

## 2018-11-08 NOTE — Progress Notes (Signed)
Pt seen, no changes from DC summary by [place] for discharge to [facility] today for End of life care  Domenic Polite, MD

## 2018-11-10 LAB — CULTURE, BLOOD (ROUTINE X 2)
Special Requests: ADEQUATE
Special Requests: ADEQUATE

## 2019-01-12 DEATH — deceased

## 2020-07-04 IMAGING — DX DG CHEST 1V PORT
1 series · 1 of 1 positions shown · non-contrast
Comparison: 07/24/2018 chest radiograph.

CLINICAL DATA: Hypoxia, fatigue, dyspnea

EXAM:
PORTABLE CHEST 1 VIEW

[chest ap]
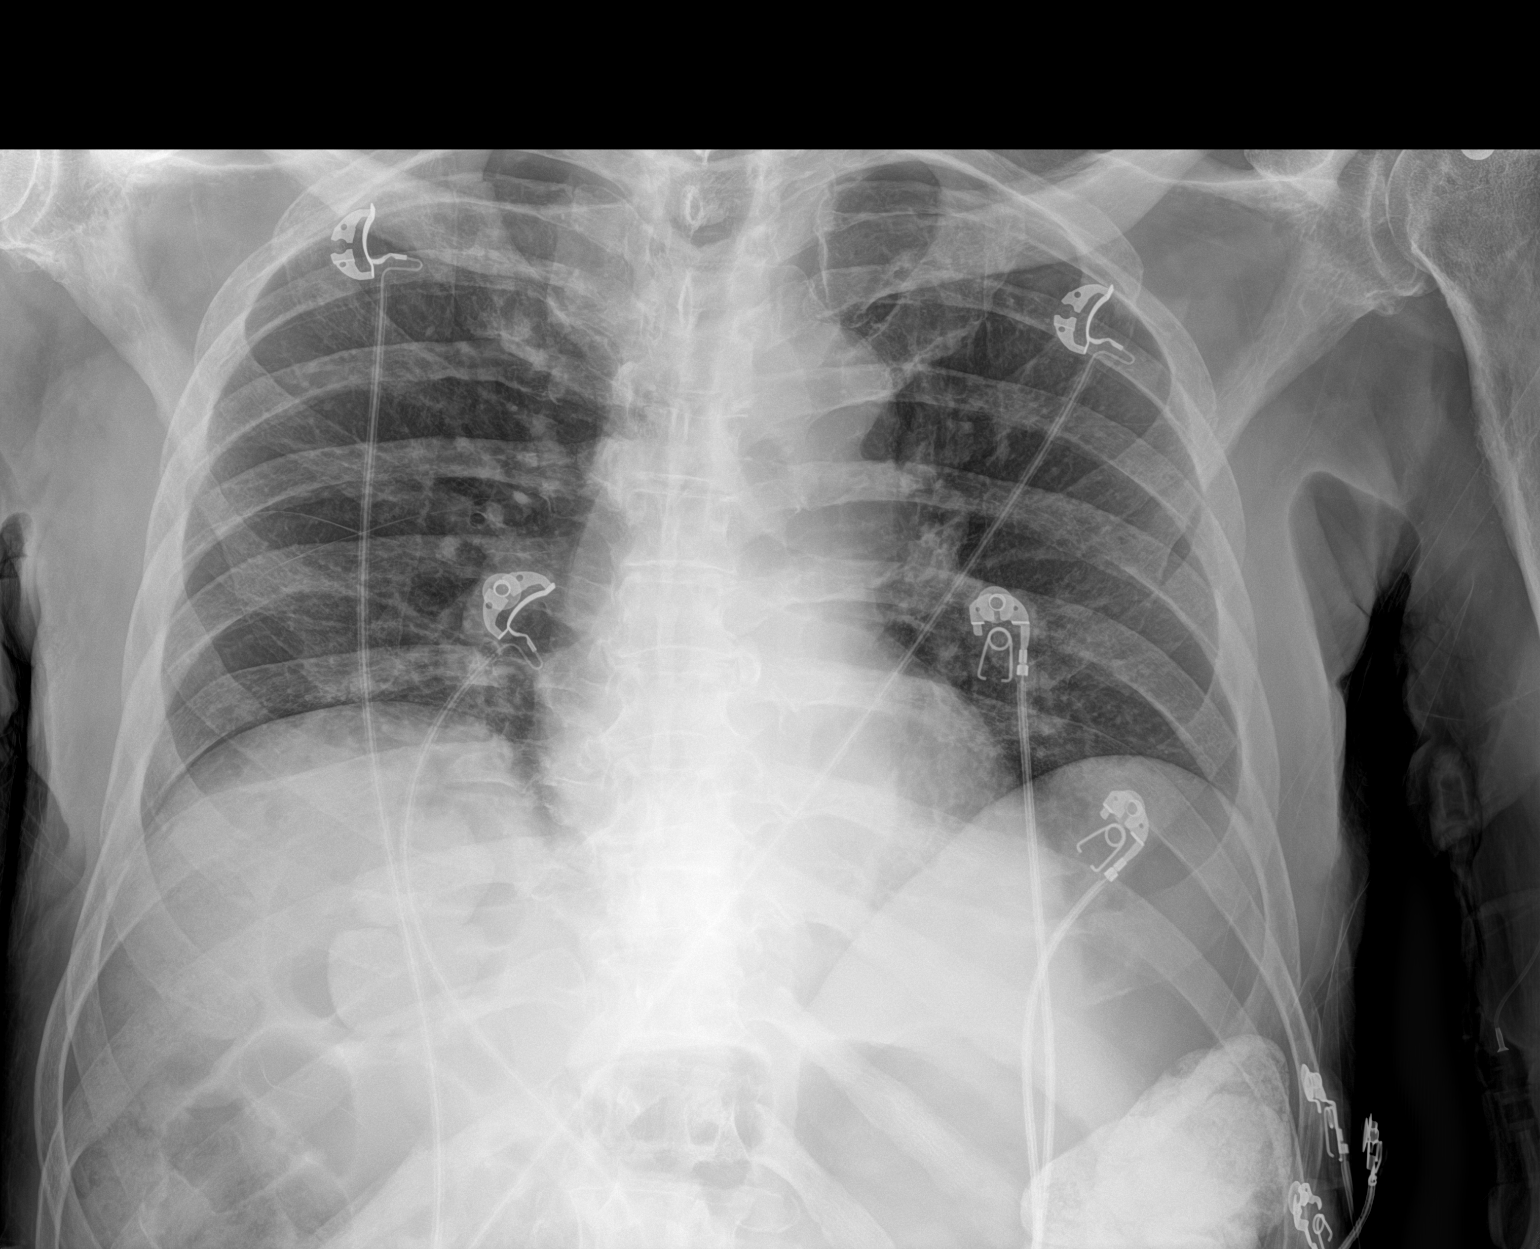

[1 of 1 positions shown; findings below may reference images not displayed]

FINDINGS: Stable cardiomediastinal silhouette with normal heart size. No
pneumothorax. No pleural effusion. Slightly low lung volumes. No
pulmonary edema. No acute consolidative airspace disease.
IMPRESSION: Low lung volumes.  No active disease in the chest.

## 2020-07-04 IMAGING — CT CT HEAD W/O CM
4 series · 15 of 47 positions shown, 17 images · non-contrast
Comparison: MR brain 10/15/2018

CLINICAL DATA: Altered level of consciousness.  Unresponsive.

EXAM:
CT HEAD WITHOUT CONTRAST
TECHNIQUE: Contiguous axial images were obtained from the base of the skull
through the vertex without intravenous contrast.

[Series 3: head without · axial · non-contrast · 0.49mm/px · z∈[-126,-6]mm · 7 of 34 slices shown, 9 images]
[im 5/34  brain]
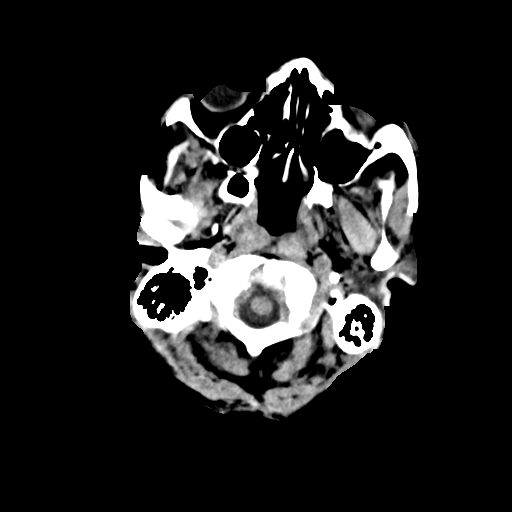
[im 5/34  bone]
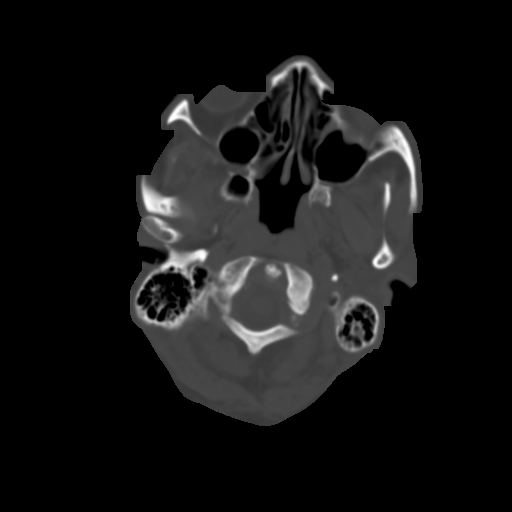
[im 9/34  brain]
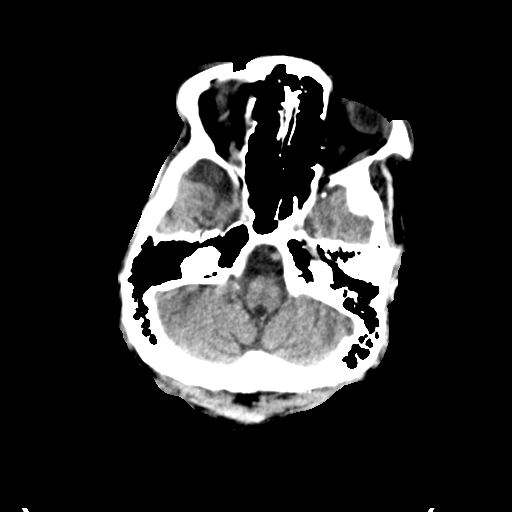
[im 13/34  brain]
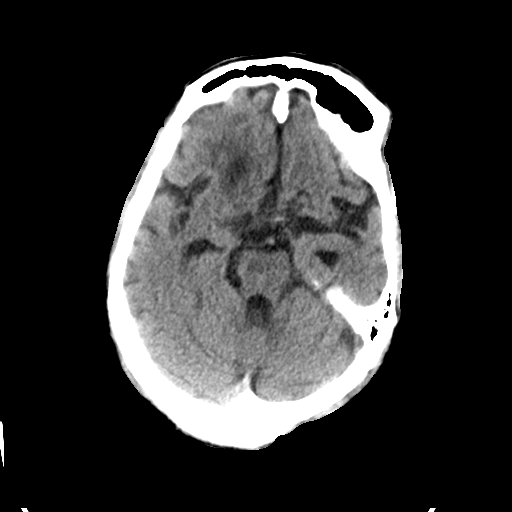
[im 17/34  brain]
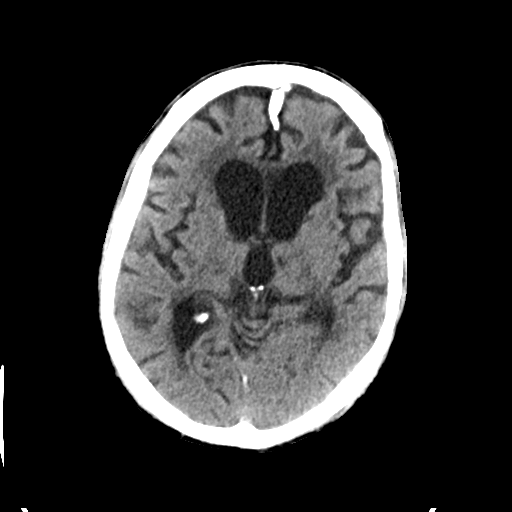
[im 21/34  brain]
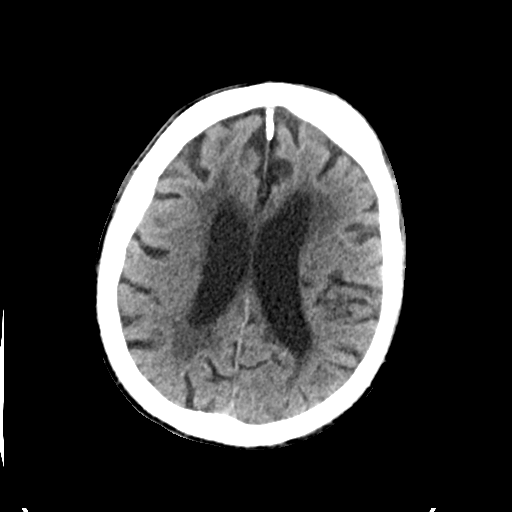
[im 21/34  bone]
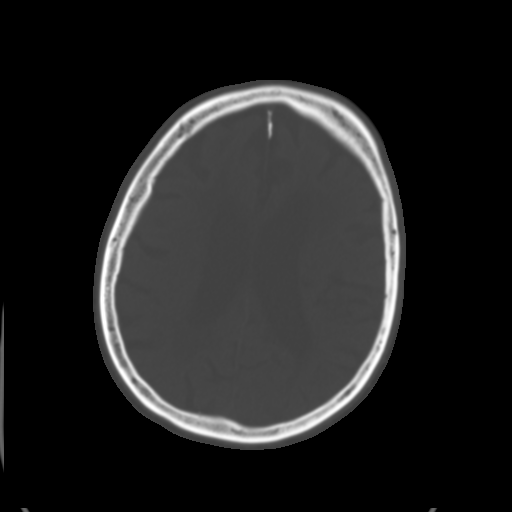
[im 25/34  brain]
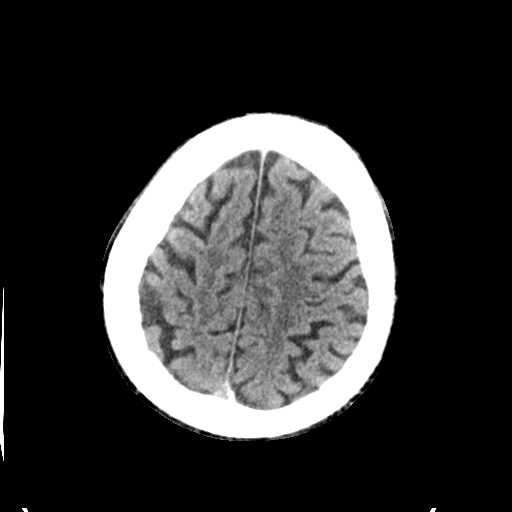
[im 29/34  brain]
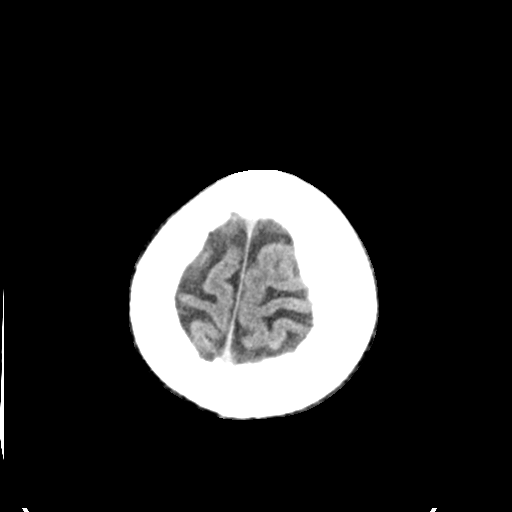

[Series 4: head bone · axial · 0.49mm/px · z∈[-130,-114]mm · 2 of 85 slices shown]
[im 9/85  bone]
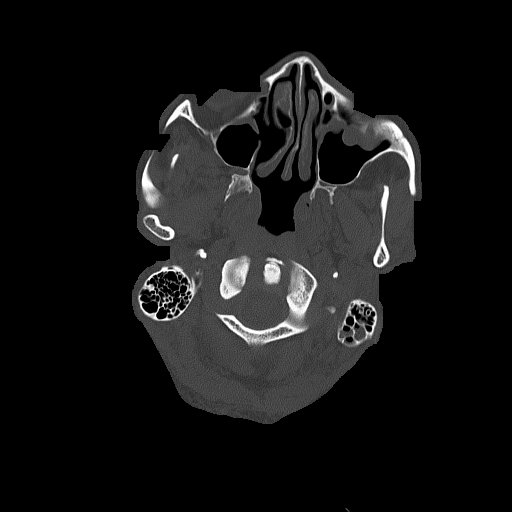
[im 17/85  bone]
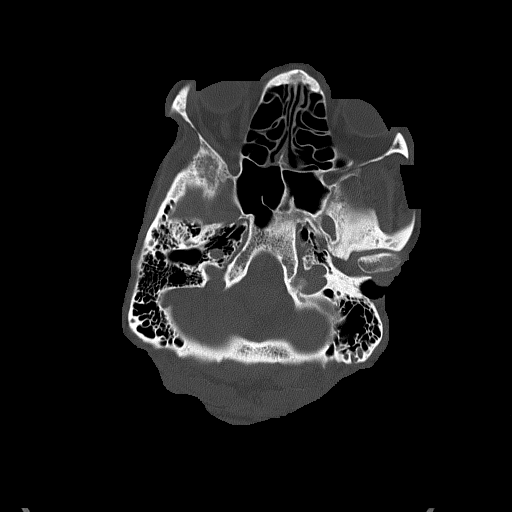

[Series 5: head without cor · coronal · non-contrast · 0.33mm/px · 3 of 71 slices shown]
[im 24/71  brain]
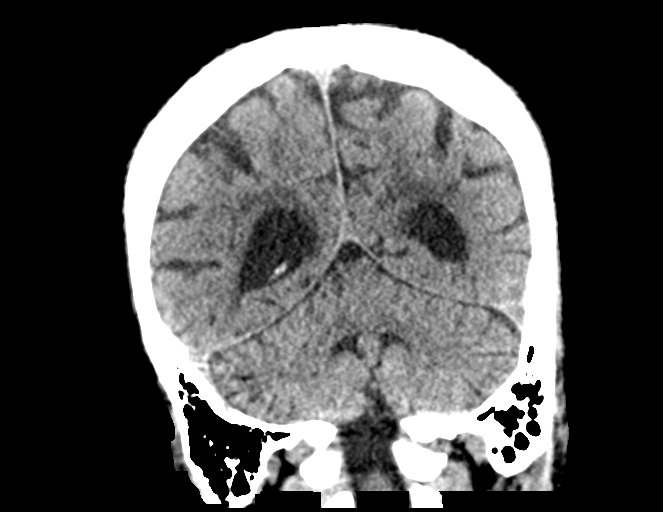
[im 32/71  brain]
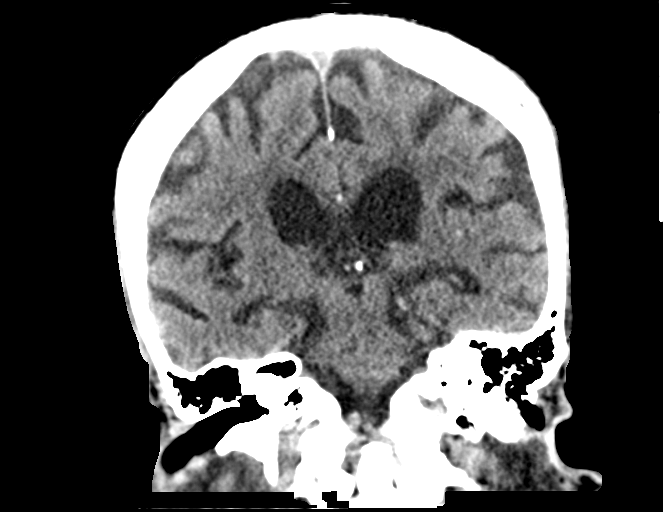
[im 39/71  brain]
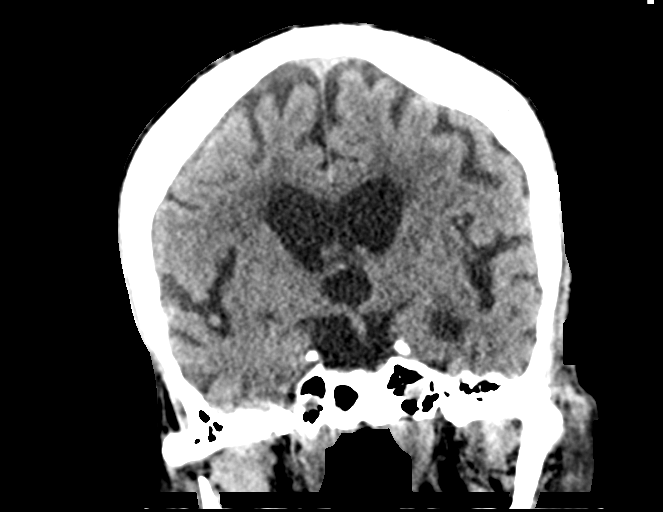

[Series 6: head without sag · sagittal · non-contrast · 0.33mm/px · 3 of 67 slices shown]
[im 23/67  brain]
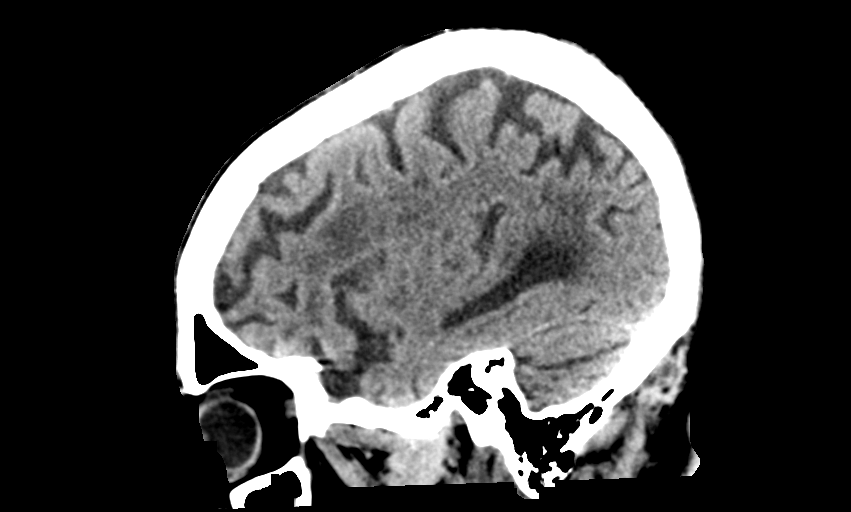
[im 34/67  brain]
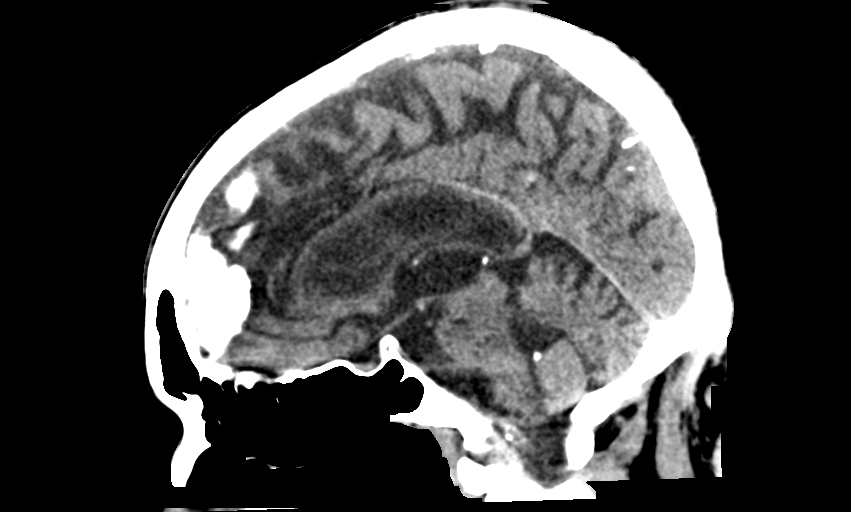
[im 45/67  brain]
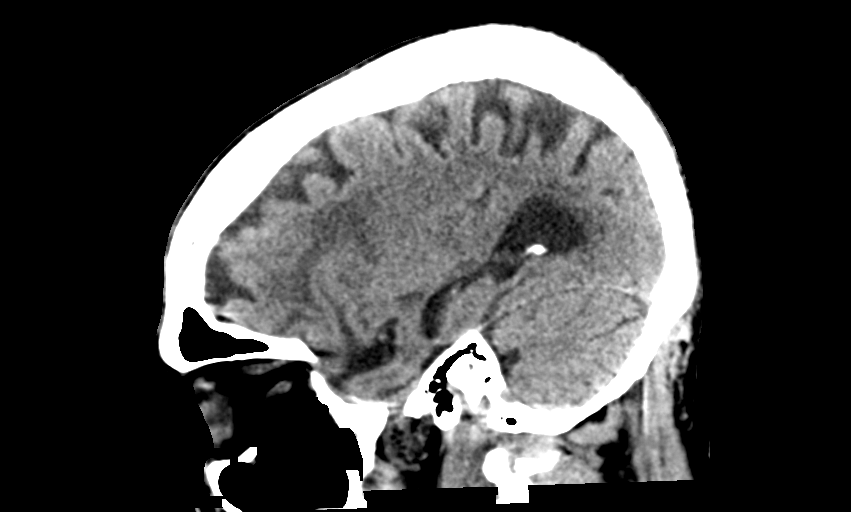

[15 of 47 positions shown; findings below may reference images not displayed]

FINDINGS: Brain: No evidence of acute infarction, hemorrhage, extra-axial
collection, ventriculomegaly, or mass effect. Generalized cerebral
atrophy. Periventricular white matter low attenuation likely
secondary to microangiopathy.

Vascular: Cerebrovascular atherosclerotic calcifications are noted.

Skull: Negative for fracture or focal lesion.

Sinuses/Orbits: Visualized portions of the orbits are unremarkable.
Mastoid sinuses are clear. Small mucous retention cysts in the
maxillary sinuses. Remainder the paranasal sinuses are clear.

Other: None.
IMPRESSION: 1. No acute intracranial pathology.
2. Chronic microvascular disease and cerebral atrophy.
